# Patient Record
Sex: Male | Born: 1965 | Race: White | Hispanic: No | Marital: Married | Smoking: Never smoker
Health system: Southern US, Community
[De-identification: ages and names within clinical notes are randomized; demographics above are authoritative.]

## PROBLEM LIST (undated history)

## (undated) DIAGNOSIS — M4306 Spondylolysis, lumbar region: Secondary | ICD-10-CM

## (undated) DIAGNOSIS — E785 Hyperlipidemia, unspecified: Secondary | ICD-10-CM

## (undated) DIAGNOSIS — G4733 Obstructive sleep apnea (adult) (pediatric): Principal | ICD-10-CM

## (undated) DIAGNOSIS — K219 Gastro-esophageal reflux disease without esophagitis: Secondary | ICD-10-CM

## (undated) HISTORY — DX: Gastro-esophageal reflux disease without esophagitis: K21.9

## (undated) HISTORY — PX: WISDOM TOOTH EXTRACTION: SHX21

## (undated) HISTORY — DX: Spondylolysis, lumbar region: M43.06

## (undated) HISTORY — PX: DG 4TH DIGIT RIGHT HAND: HXRAD1651

## (undated) HISTORY — PX: TONSILLECTOMY: SUR1361

## (undated) HISTORY — DX: Hyperlipidemia, unspecified: E78.5

## (undated) HISTORY — PX: HERNIA REPAIR: SHX51

## (undated) HISTORY — DX: Obstructive sleep apnea (adult) (pediatric): G47.33

---

## 2016-01-15 ENCOUNTER — Ambulatory Visit (INDEPENDENT_AMBULATORY_CARE_PROVIDER_SITE_OTHER): Payer: 59 | Admitting: Family Medicine

## 2016-01-15 ENCOUNTER — Encounter: Payer: Self-pay | Admitting: Family Medicine

## 2016-01-15 VITALS — BP 126/80 | HR 80 | Temp 97.9°F | Resp 16 | Ht 73.0 in | Wt 280.0 lb

## 2016-01-15 DIAGNOSIS — Z0000 Encounter for general adult medical examination without abnormal findings: Secondary | ICD-10-CM | POA: Diagnosis not present

## 2016-01-15 DIAGNOSIS — M4306 Spondylolysis, lumbar region: Secondary | ICD-10-CM | POA: Insufficient documentation

## 2016-01-15 LAB — CBC WITH DIFFERENTIAL/PLATELET
BASOS ABS: 0.1 10*3/uL (ref 0.0–0.1)
Basophils Relative: 1 % (ref 0–1)
EOS PCT: 4 % (ref 0–5)
Eosinophils Absolute: 0.2 10*3/uL (ref 0.0–0.7)
HEMATOCRIT: 45.9 % (ref 39.0–52.0)
Hemoglobin: 16.1 g/dL (ref 13.0–17.0)
LYMPHS PCT: 31 % (ref 12–46)
Lymphs Abs: 1.7 10*3/uL (ref 0.7–4.0)
MCH: 30.4 pg (ref 26.0–34.0)
MCHC: 35.1 g/dL (ref 30.0–36.0)
MCV: 86.8 fL (ref 78.0–100.0)
MPV: 10 fL (ref 8.6–12.4)
Monocytes Absolute: 0.6 10*3/uL (ref 0.1–1.0)
Monocytes Relative: 10 % (ref 3–12)
Neutro Abs: 3 10*3/uL (ref 1.7–7.7)
Neutrophils Relative %: 54 % (ref 43–77)
PLATELETS: 206 10*3/uL (ref 150–400)
RBC: 5.29 MIL/uL (ref 4.22–5.81)
RDW: 13.1 % (ref 11.5–15.5)
WBC: 5.6 10*3/uL (ref 4.0–10.5)

## 2016-01-15 NOTE — Progress Notes (Signed)
Subjective:    Patient ID: Adam [name], male    DOB: 1966-06-03, 50 y.o.   MRN: [id number]  HPI  patient is here today to establish care. He has no major concerns. 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 He is currently in physical therapy which seems to be helping. Otherwise his only complaint is some mild pain in the midfoot of his right foot that seems to be improving. He is due for a flu shot but he politely declines Past Medical History  Diagnosis Date  . Lumbar pars defect     right sciatica/ Dr Lowella Dell   Past Surgical History  Procedure Laterality Date  . Hernia repair    . Tonsillectomy     No current outpatient prescriptions on file prior to visit.   No current facility-administered medications on file prior to visit.   No Known Allergies Social History   Social History  . Marital Status: Married    Spouse Name: N/A  . Number of Children: N/A  . Years of Education: N/A   Occupational History  . Not on file.   Social History Main Topics  . Smoking status: Never Smoker   . Smokeless tobacco: Not on file  . Alcohol Use: 0.0 oz/week    0 Standard drinks or equivalent per week     Comment: Very seldom  . Drug Use: No  . Sexual Activity: Not on file   Other Topics Concern  . Not on file   Social History Narrative  . No narrative on file   Family History  Problem Relation Age of Onset  . Dementia Mother   . Atrial fibrillation Father   . COPD Maternal Grandfather       Review of Systems  All other systems reviewed and are negative.      Objective:   Physical Exam  Constitutional: He is oriented to person, place, and time. He appears well-developed and well-nourished. No distress.  HENT:  Head: Normocephalic and atraumatic.  Right Ear: External ear normal.  Left Ear: External ear normal.  Nose: Nose normal.  Mouth/Throat: Oropharynx is clear and moist. No oropharyngeal  exudate.  Eyes: Conjunctivae and EOM are normal. Pupils are equal, round, and reactive to light. Right eye exhibits no discharge. Left eye exhibits no discharge. No scleral icterus.  Neck: Normal range of motion. Neck supple. No JVD present. No tracheal deviation present. No thyromegaly present.  Cardiovascular: Normal rate, regular rhythm, normal heart sounds and intact distal pulses.  Exam reveals no gallop and no friction rub.   No murmur heard. Pulmonary/Chest: Effort normal and breath sounds normal. No stridor. No respiratory distress. He has no wheezes. He has no rales. He exhibits no tenderness.  Abdominal: Soft. Bowel sounds are normal. He exhibits no distension and no mass. There is no tenderness. There is no rebound and no guarding. Hernia confirmed negative in the right inguinal area and confirmed negative in the left inguinal area.  Genitourinary: Rectum normal, prostate normal, testes normal and penis normal. Cremasteric reflex is present.  Musculoskeletal: Normal range of motion. He exhibits no edema or tenderness.  Lymphadenopathy:    He has no cervical adenopathy.       Right: No inguinal adenopathy present.       Left: No inguinal adenopathy present.  Neurological: He is alert and oriented to person, place, and time. He has normal reflexes. He displays normal reflexes. No  cranial nerve deficit. He exhibits normal muscle tone. Coordination normal.  Skin: Skin is warm. No rash noted. He is not diaphoretic. No erythema. No pallor.  Psychiatric: He has a normal mood and affect. His behavior is normal. Judgment and thought content normal.  Vitals reviewed.         Assessment & Plan:  Routine general medical examination at a health care facility - Plan: CBC with Differential/Platelet, COMPLETE METABOLIC PANEL WITH GFR, Lipid panel, PSA, Ambulatory referral to Gastroenterology   Physical exam is completely normal except for obesity. I recommended diet exercise and weight loss. I  will check a CBC, CMP, fasting lipid panel, and a PSA. I will also efer patient to GI for colonoscopy. I recommended a flu shot which he declined. The  Remainder of his preventative care is up-to-date

## 2016-01-16 LAB — LIPID PANEL
Cholesterol: 192 mg/dL (ref 125–200)
HDL: 53 mg/dL (ref >=40)
LDL CALC: 100 mg/dL (ref <130)
Total CHOL/HDL Ratio: 3.6 Ratio (ref <=5.0)
Triglycerides: 195 mg/dL — ABNORMAL HIGH (ref <150)
VLDL: 39 mg/dL — AB (ref <30)

## 2016-01-16 LAB — COMPLETE METABOLIC PANEL WITH GFR
ALT: 31 U/L (ref 9–46)
AST: 20 U/L (ref 10–40)
Albumin: 4.4 g/dL (ref 3.6–5.1)
Alkaline Phosphatase: 63 U/L (ref 40–115)
BUN: 13 mg/dL (ref 7–25)
CALCIUM: 9.3 mg/dL (ref 8.6–10.3)
CHLORIDE: 102 mmol/L (ref 98–110)
CO2: 27 mmol/L (ref 20–31)
CREATININE: 1.06 mg/dL (ref 0.60–1.35)
GFR, Est African American: >89 mL/min (ref >=60)
GFR, Est Non African American: 82 mL/min (ref >=60)
GLUCOSE: 94 mg/dL (ref 70–99)
Potassium: 4.3 mmol/L (ref 3.5–5.3)
Sodium: 140 mmol/L (ref 135–146)
Total Bilirubin: 0.4 mg/dL (ref 0.2–1.2)
Total Protein: 6.6 g/dL (ref 6.1–8.1)

## 2016-01-16 LAB — PSA: PSA: 1.6 ng/mL (ref <=4.00)

## 2016-01-18 ENCOUNTER — Encounter: Payer: Self-pay | Admitting: Family Medicine

## 2016-02-29 ENCOUNTER — Telehealth: Payer: Self-pay | Admitting: Family Medicine

## 2016-02-29 DIAGNOSIS — R0683 Snoring: Secondary | ICD-10-CM

## 2016-02-29 NOTE — Telephone Encounter (Signed)
Patients wife called and states that patient needs a referral for sleep clinic. She states he forgot to ask Dr. Dennard Schaumann about this when he was here for his CPE.  CB# for patient is [phone] or cell [phone]

## 2016-03-01 NOTE — Telephone Encounter (Signed)
OK to schedule - no mention of it in OV note - does he need f/u?

## 2016-03-01 NOTE — Telephone Encounter (Signed)
Ok to schedule.

## 2016-03-02 NOTE — Telephone Encounter (Signed)
Referral sent 

## 2016-03-03 ENCOUNTER — Encounter: Payer: Self-pay | Admitting: Gastroenterology

## 2016-04-01 ENCOUNTER — Ambulatory Visit (AMBULATORY_SURGERY_CENTER): Payer: Self-pay | Admitting: *Deleted

## 2016-04-01 ENCOUNTER — Encounter: Payer: Self-pay | Admitting: Gastroenterology

## 2016-04-01 VITALS — Ht 73.0 in | Wt 278.8 lb

## 2016-04-01 DIAGNOSIS — Z1211 Encounter for screening for malignant neoplasm of colon: Secondary | ICD-10-CM

## 2016-04-01 MED ORDER — SUPREP BOWEL PREP KIT 17.5-3.13-1.6 GM/177ML PO SOLN: 1.0000 | Freq: Once | ORAL | Status: DC

## 2016-04-01 NOTE — Progress Notes (Signed)
Patient denies any allergies to egg or soy products. Patient denies complications with anesthesia/sedation.  Patient denies oxygen use at home and denies diet medications. Emmi instructions for colonoscopy explained and given to patient.  

## 2016-04-15 ENCOUNTER — Encounter: Payer: Self-pay | Admitting: Gastroenterology

## 2016-04-15 ENCOUNTER — Ambulatory Visit (AMBULATORY_SURGERY_CENTER): Payer: 59 | Admitting: Gastroenterology

## 2016-04-15 VITALS — BP 134/75 | HR 61 | Temp 98.6°F | Resp 9 | Ht 73.0 in | Wt 278.0 lb

## 2016-04-15 DIAGNOSIS — D127 Benign neoplasm of rectosigmoid junction: Secondary | ICD-10-CM

## 2016-04-15 DIAGNOSIS — D125 Benign neoplasm of sigmoid colon: Secondary | ICD-10-CM

## 2016-04-15 DIAGNOSIS — D122 Benign neoplasm of ascending colon: Secondary | ICD-10-CM

## 2016-04-15 DIAGNOSIS — Z1211 Encounter for screening for malignant neoplasm of colon: Secondary | ICD-10-CM

## 2016-04-15 MED ORDER — SODIUM CHLORIDE 0.9 % IV SOLN: 500.0000 mL | INTRAVENOUS | Status: DC

## 2016-04-15 NOTE — Patient Instructions (Signed)
YOU HAD AN ENDOSCOPIC PROCEDURE TODAY AT [facility]:   Refer to the procedure report that was given to you for any specific questions about what was found during the examination.  If the procedure report does not answer your questions, please call your gastroenterologist to clarify.  If you requested that your care partner not be given the details of your procedure findings, then the procedure report has been included in a sealed envelope for you to review at your convenience later.  YOU SHOULD EXPECT: Some feelings of bloating in the abdomen. Passage of more gas than usual.  Walking can help get rid of the air that was put into your GI tract during the procedure and reduce the bloating. If you had a lower endoscopy (such as a colonoscopy or flexible sigmoidoscopy) you may notice spotting of blood in your stool or on the toilet paper. If you underwent a bowel prep for your procedure, you may not have a normal bowel movement for a few days.  Please Note:  You might notice some irritation and congestion in your nose or some drainage.  This is from the oxygen used during your procedure.  There is no need for concern and it should clear up in a day or so.  SYMPTOMS TO REPORT IMMEDIATELY:   Following lower endoscopy (colonoscopy or flexible sigmoidoscopy):  Excessive amounts of blood in the stool  Significant tenderness or worsening of abdominal pains  Swelling of the abdomen that is new, acute  Fever of 100F or higher   For urgent or emergent issues, a gastroenterologist can be reached at any hour by calling [phone].   DIET: Your first meal following the procedure should be a small meal and then it is ok to progress to your normal diet. Heavy or fried foods are harder to digest and may make you feel nauseous or bloated.  Likewise, meals heavy in dairy and vegetables can increase bloating.  Drink plenty of fluids but you should avoid alcoholic beverages for 24  hours.   PLease read all handouts given to you by your recovery nurse. ACTIVITY:  You should plan to take it easy for the rest of today and you should NOT DRIVE or use heavy machinery until tomorrow (because of the sedation medicines used during the test).    FOLLOW UP: Our staff will call the number listed on your records the next business day following your procedure to check on you and address any questions or concerns that you may have regarding the information given to you following your procedure. If we do not reach you, we will leave a message.  However, if you are feeling well and you are not experiencing any problems, there is no need to return our call.  We will assume that you have returned to your regular daily activities without incident.  If any biopsies were taken you will be contacted by phone or by letter within the next 1-3 weeks.  Please call us at [phone] if you have not heard about the biopsies in 3 weeks.    SIGNATURES/CONFIDENTIALITY: You and/or your care partner have signed paperwork which will be entered into your electronic medical record.  These signatures attest to the fact that that the information above on your After Visit Summary has been reviewed and is understood.  Full responsibility of the confidentiality of this discharge information lies with you and/or your care-partner.  Thank you for letting us take care of your healthcare needs today.

## 2016-04-15 NOTE — Progress Notes (Signed)
Called to room to assist during endoscopic procedure.  Patient ID and intended procedure confirmed with present staff. Received instructions for my participation in the procedure from the performing physician.  

## 2016-04-15 NOTE — Progress Notes (Signed)
Stable to RR 

## 2016-04-15 NOTE — Op Note (Signed)
[facility] Patient Name: Adam [name] Procedure Date: 04/15/2016 8:35 AM MRN: [id number] Endoscopist: Mauri Pole , MD Age: 50 Referring MD:  Date of Birth: 04-06-1966 Gender: Male Procedure:                Colonoscopy Indications:              Screening for colorectal malignant neoplasm, This                            is the patient's first colonoscopy Medicines:                Monitored Anesthesia Care Procedure:                Pre-Anesthesia Assessment:                           - Prior to the procedure, a History and Physical                            was performed, and patient medications and                            allergies were reviewed. The patient's tolerance of                            previous anesthesia was also reviewed. The risks                            and benefits of the procedure and the sedation                            options and risks were discussed with the patient.                            All questions were answered, and informed consent                            was obtained. Prior Anticoagulants: The patient has                            taken no previous anticoagulant or antiplatelet                            agents. ASA Grade Assessment: II - A patient with                            mild systemic disease. After reviewing the risks                            and benefits, the patient was deemed in                            satisfactory condition to undergo the procedure.  After obtaining informed consent, the colonoscope                            was passed under direct vision. Throughout the                            procedure, the patient's blood pressure, pulse, and                            oxygen saturations were monitored continuously. The                            Model CF-HQ190L [id number]) scope was introduced                            through the anus and advanced to the the cecum,                             identified by appendiceal orifice and ileocecal                            valve. The colonoscopy was performed without                            difficulty. The patient tolerated the procedure                            well. The quality of the bowel preparation was                            excellent. The ileocecal valve, appendiceal                            orifice, and rectum were photographed. Scope In: 8:43:36 AM Scope Out: 9:02:02 AM Scope Withdrawal Time: 0 hours 15 minutes 30 seconds  Total Procedure Duration: 0 hours 18 minutes 26 seconds  Findings:                 The perianal and digital rectal examinations were                            normal.                           Four sessile polyps were found in the sigmoid colon                            and ascending colon. The polyps were 4 to 7 mm in                            size. These polyps were removed with a cold snare.                            Resection and retrieval were complete.  Two sessile polyps were found in the recto-sigmoid                            colon. The polyps were 2 to 4 mm in size. These                            polyps were removed with a cold biopsy forceps.                            Resection and retrieval were complete.                           Non-bleeding internal hemorrhoids were found during                            retroflexion. The hemorrhoids were small.                           A few small-mouthed diverticula were found in the                            descending colon. Complications:            No immediate complications. Estimated Blood Loss:     Estimated blood loss was minimal. Impression:               - Four 4 to 7 mm polyps in the sigmoid colon and in                            the ascending colon, removed with a cold snare.                            Resected and retrieved.                           - Two 2 to 4 mm  polyps at the recto-sigmoid colon,                            removed with a cold biopsy forceps. Resected and                            retrieved.                           - Non-bleeding internal hemorrhoids.                           - Diverticulosis in the descending colon. Recommendation:           - Patient has a contact number available for                            emergencies. The signs and symptoms of potential  delayed complications were discussed with the                            patient. Return to normal activities tomorrow.                            Written discharge instructions were provided to the                            patient.                           - Resume previous diet.                           - Continue present medications.                           - Await pathology results.                           - Repeat colonoscopy in 3 - 5 years for                            surveillance.                           - Return to GI clinic PRN. Mauri Pole, MD 04/15/2016 9:07:26 AM This report has been signed electronically.

## 2016-04-18 ENCOUNTER — Telehealth: Payer: Self-pay | Admitting: *Deleted

## 2016-04-18 NOTE — Telephone Encounter (Signed)
  Follow up Call-  Call back number 04/15/2016  Post procedure Call Back phone  # [phone]  Permission to leave phone message Yes   [facility]

## 2016-04-22 ENCOUNTER — Encounter: Payer: Self-pay | Admitting: Gastroenterology

## 2016-08-04 ENCOUNTER — Encounter: Payer: Self-pay | Admitting: Family Medicine

## 2016-08-04 ENCOUNTER — Ambulatory Visit (INDEPENDENT_AMBULATORY_CARE_PROVIDER_SITE_OTHER): Payer: 59 | Admitting: Family Medicine

## 2016-08-04 VITALS — BP 130/78 | HR 78 | Temp 97.4°F | Resp 16 | Ht 73.0 in | Wt 277.0 lb

## 2016-08-04 DIAGNOSIS — G4730 Sleep apnea, unspecified: Secondary | ICD-10-CM | POA: Diagnosis not present

## 2016-08-04 DIAGNOSIS — G4710 Hypersomnia, unspecified: Secondary | ICD-10-CM | POA: Diagnosis not present

## 2016-08-04 DIAGNOSIS — R0683 Snoring: Secondary | ICD-10-CM

## 2016-08-04 NOTE — Progress Notes (Signed)
Subjective:    Patient ID: Adam [name], male    DOB: 08-24-1966, 50 y.o.   MRN: [id number]  HPI  01/2016 Patient is here today to establish care. He has no major concerns. 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 He is currently in physical therapy which seems to be helping. Otherwise his only complaint is some mild pain in the midfoot of his right foot that seems to be improving. He is due for a flu shot but he politely declines.  At that time, my plan was:  Physical exam is completely normal except for obesity. I recommended diet exercise and weight loss. I will check a CBC, CMP, fasting lipid panel, and a PSA. I will also efer patient to GI for colonoscopy. I recommended a flu shot which he declined. The  Remainder of his preventative care is up-to-date  08/04/16 Patient states that he was diagnosed with sleep apnea in the remote past. However he never went through the titration study to be established with CPAP. He states that his wife is concerned because she hears him snore loudly and also stop breathing occasionally. On examination today he does have a large uvula and a low hanging palate. His neck circumference is 17 inches. His mandible length is over 5 inches. However his Epworth sleepiness score is 12-13 indicating a high likelihood of sleep apnea. The patient states that he falls asleep occasionally while driving. This is a high likelihood of recurring if he's not drinking something with caffeine. He can fall asleep easily while sitting still, lying down in the afternoon, reading, watching TV. If he is keeping himself busy he does not fall sleep while busy. Therefore he does not fall asleep if he's talking to an individual. However he does report hypersomnolence Past Medical History:  Diagnosis Date  . GERD (gastroesophageal reflux disease)   . 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)   Past Surgical  History:  Procedure Laterality Date  . DG 4TH DIGIT RIGHT HAND    . HERNIA REPAIR    . TONSILLECTOMY    . WISDOM TOOTH EXTRACTION     Current Outpatient Prescriptions on File Prior to Visit  Medication Sig Dispense Refill  . acetaminophen (TYLENOL) 500 MG tablet Take 500 mg by mouth every 6 (six) hours as needed.    . Ascorbic Acid (VITAMIN C) 1000 MG tablet Take 1,000 mg by mouth daily.    [name] b complex vitamins tablet Take 1 tablet by mouth daily.    . Cholecalciferol (VITAMIN D) 2000 units CAPS Take by mouth.    . famotidine (PEPCID) 20 MG tablet Take 20 mg by mouth daily. 4 - 5 times per week    . Lysine 500 MG TABS Take 500 mg by mouth as needed.    . meloxicam (MOBIC) 15 MG tablet Take 15 mg by mouth daily.    [name] OVER THE COUNTER MEDICATION Take 1 tablet by mouth daily. Multi Vitamin - [employer] Brand     No current facility-administered medications on file prior to visit.    No Known Allergies Social History   Social History  . Marital status: Married    Spouse name: N/A  . Number of children: N/A  . Years of education: N/A   Occupational History  . Not on file.   Social History Main Topics  . Smoking status: Never Smoker  . Smokeless  tobacco: Never Used  . Alcohol use 0.0 oz/week     Comment: Very seldom - beer  . Drug use: No  . Sexual activity: Not on file   Other Topics Concern  . Not on file   Social History Narrative  . No narrative on file   Family History  Problem Relation Age of Onset  . Dementia Mother   . Atrial fibrillation Father   . Colon polyps Father   . COPD Maternal Grandfather   . Colon cancer Neg Hx   . Esophageal cancer Neg Hx   . Rectal cancer Neg Hx   . Stomach cancer Neg Hx       Review of Systems  All other systems reviewed and are negative.      Objective:   Physical Exam  Constitutional: He is oriented to person, place, and time. He appears well-developed and well-nourished. No distress.  HENT:  Head: Normocephalic and  atraumatic.  Right Ear: External ear normal.  Left Ear: External ear normal.  Nose: Nose normal.  Mouth/Throat: Oropharynx is clear and moist. No oropharyngeal exudate.  Neck: Normal range of motion. Neck supple. No JVD present. No tracheal deviation present. No thyromegaly present.  Cardiovascular: Normal rate, regular rhythm, normal heart sounds and intact distal pulses.  Exam reveals no gallop and no friction rub.   No murmur heard. Pulmonary/Chest: Effort normal and breath sounds normal. No stridor. No respiratory distress. He has no wheezes. He has no rales. He exhibits no tenderness.  Genitourinary: Testes normal. Cremasteric reflex is present.  Lymphadenopathy:    He has no cervical adenopathy.  Neurological: He is alert and oriented to person, place, and time. He has normal reflexes. No cranial nerve deficit. He exhibits normal muscle tone. Coordination normal.  Skin: Skin is warm. No rash noted. He is not diaphoretic. No erythema. No pallor.  Psychiatric: He has a normal mood and affect. His behavior is normal. Judgment and thought content normal.  Vitals reviewed.         Assessment & Plan:  Patient certainly is at high risk for obstructive sleep apnea. I will schedule the patient for split level sleep study through [facility] neurology. Follow-up after the study has been performed

## 2016-08-31 ENCOUNTER — Other Ambulatory Visit: Payer: Self-pay | Admitting: Family Medicine

## 2016-08-31 DIAGNOSIS — G4710 Hypersomnia, unspecified: Secondary | ICD-10-CM

## 2016-08-31 DIAGNOSIS — R0683 Snoring: Secondary | ICD-10-CM

## 2016-08-31 DIAGNOSIS — G4730 Sleep apnea, unspecified: Secondary | ICD-10-CM

## 2016-09-06 ENCOUNTER — Ambulatory Visit (INDEPENDENT_AMBULATORY_CARE_PROVIDER_SITE_OTHER): Payer: 59 | Admitting: Neurology

## 2016-09-06 ENCOUNTER — Encounter: Payer: Self-pay | Admitting: Neurology

## 2016-09-06 VITALS — BP 138/88 | HR 72 | Resp 20 | Ht 73.0 in | Wt 281.0 lb

## 2016-09-06 DIAGNOSIS — G4733 Obstructive sleep apnea (adult) (pediatric): Secondary | ICD-10-CM | POA: Insufficient documentation

## 2016-09-06 DIAGNOSIS — G4726 Circadian rhythm sleep disorder, shift work type: Secondary | ICD-10-CM

## 2016-09-06 DIAGNOSIS — G4730 Sleep apnea, unspecified: Secondary | ICD-10-CM

## 2016-09-06 DIAGNOSIS — G4710 Hypersomnia, unspecified: Secondary | ICD-10-CM | POA: Diagnosis not present

## 2016-09-06 HISTORY — DX: Obstructive sleep apnea (adult) (pediatric): G47.33

## 2016-09-06 NOTE — Patient Instructions (Signed)

## 2016-09-06 NOTE — Progress Notes (Signed)
SLEEP MEDICINE CLINIC   Provider:  Larey Seat, M D  Referring Provider: Susy Frizzle, MD Primary Care Physician:  Adam Fraction, MD  Chief Complaint  Patient presents with  . New Patient (Initial Visit)    had sleep study in 2012 but no copy of it    HPI:  Adam [name] is a 50 y.o. male , seen here as a referral from Dr. Dennard [name] .   Chief complaint according to patient : " just being tired a lot "   Mr. Adam [name] had actually been evaluated for sleep apnea and was found in 2012 that he had sleep apnea, he was supposed to return for CPAP titration but in the meantime was transferred and his job took him to a different part of the [place], he moved to [place] and [place]. He works for [employer] 31 years.  He is now returned to [facility] and stated to his primary care physician that he still suffers from daytime fatigue, sleepiness overall her lack of energy. He is currently also suffering from some lower back pain, has completed physical therapy and has discussed with his primary care physician if injections may help this particular concern. He saw Dr. Dennard [name] on 08/04/2016 and mentioned that his wife is concerned because she her some snore loudly and sometimes to stop breathing. He indicated also that he was excessively daytime sleepy endorsing the Epworth sleepiness score at 13 points. He can fall easily asleep, if he is physically not active or mentally not stimulated.  He also suffers from some acid reflux and right-sided sciatica.  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  It'll take about 30-40 minutes to wind down to be ready to go to sleep. He will retreat to the bedroom usually around 4- 5 AM. Usually he will sleep within 30 minutes or less, his bedroom is corneal, quiet and dark. He sleeps usually on his side, he usually uses one  pillow for head support. The head of bed is not elevated. His wife has noted him to snore in any position. He usually has about 3 bathroom breaks each night, after which he  has no trouble going back to sleep. His wife homeschools the children ( 53, 41 and 48 years old), he can sleep through. He uses a fan.  He usually tries to arise by 11 AM, needs an alarm. He  feels still very tired at that time. His sleep has not been as refreshing and restoring. Occasionally he will have a dry mouth but he does not have morning headaches, no diaphoresis or palpitations were noted.  Sleep medical history and family sleep history:  Mother has OSA, one sister healthy, is now 76 year old son used to sleep walk in childhood.   Social history: married, with children, full time employed, Surveyor, quantity , shift Insurance underwriter.  Caffeine use ; decaffeinated tea, 3-4 glasses, 1 mountain dew a day, coffee- none.    Review of Systems: Out of a complete 14 system review, the patient complains of only the following symptoms, and all other reviewed systems are negative. Snoring, daytime fatigue, shift work  Epworth score 16 , Fatigue severity score 31  , depression score 2/15    Social History   Social History  . Marital status: Married    Spouse name: N/A  . Number of children: N/A  .  Years of education: N/A   Occupational History  . Not on file.   Social History Main Topics  . Smoking status: Never Smoker  . Smokeless tobacco: Never Used  . Alcohol use 0.0 oz/week     Comment: Very seldom - beer  . Drug use: No  . Sexual activity: Not on file   Other Topics Concern  . Not on file   Social History Narrative  . No narrative on file    Family History  Problem Relation Age of Onset  . Dementia Mother   . Atrial fibrillation Father   . Colon polyps Father   . COPD Maternal Grandfather   . Colon cancer Neg Hx   . Esophageal cancer Neg Hx   . Rectal cancer Neg Hx   . Stomach cancer Neg Hx      Past Medical History:  Diagnosis Date  . GERD (gastroesophageal reflux disease)   . 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)    Past Surgical History:  Procedure Laterality Date  . DG 4TH DIGIT RIGHT HAND    . HERNIA REPAIR    . TONSILLECTOMY    . WISDOM TOOTH EXTRACTION      Current Outpatient Prescriptions  Medication Sig Dispense Refill  . acetaminophen (TYLENOL) 500 MG tablet Take 500 mg by mouth every 6 (six) hours as needed.    . Ascorbic Acid (VITAMIN C) 1000 MG tablet Take 1,000 mg by mouth daily.    [name] b complex vitamins tablet Take 1 tablet by mouth daily.    . Cholecalciferol (VITAMIN D) 2000 units CAPS Take by mouth.    . famotidine (PEPCID) 20 MG tablet Take 20 mg by mouth daily. 4 - 5 times per week    . ibuprofen (ADVIL,MOTRIN) 600 MG tablet Take 600 mg by mouth every 6 (six) hours as needed.    [name] Lysine 500 MG TABS Take 500 mg by mouth as needed.    . meloxicam (MOBIC) 15 MG tablet Take 15 mg by mouth daily.    [name] OVER THE COUNTER MEDICATION Take 1 tablet by mouth daily. Multi Vitamin - [employer] Brand     No current facility-administered medications for this visit.     Allergies as of 09/06/2016  . (No Known Allergies)    Vitals: BP 138/88   Pulse 72   Resp 20   Ht 6\' 1"  (1.854 m)   Wt 281 lb (127.5 kg)   BMI 37.07 kg/m  Last Weight:  Wt Readings from Last 1 Encounters:  09/06/16 281 lb (127.5 kg)   [id number] mass index is 37.07 kg/m.     Last Height:   Ht Readings from Last 1 Encounters:  09/06/16 6\' 1"  (1.854 m)    Physical exam:  General: The patient is awake, alert and appears not in acute distress. The patient is well groomed. Head: Normocephalic, atraumatic. Neck is supple. Mallampati 2-3 , uvula lifts above tongue ground,,  neck circumference:17.25  Nasal airflow patent , TMJ is not evident . Retrognathia is not seen.  Cardiovascular:  Regular rate and rhythm, without  murmurs or carotid bruit, and without distended neck  veins. Respiratory: Lungs are clear to auscultation. Skin:  Without evidence of edema, or rash Trunk: BMI is 37. The patient's posture is erect.   Neurologic exam : The patient is awake and alert, oriented to place and time.   Memory subjective described as intact.  Attention span & concentration ability appears normal.  Speech is fluent, without  dysarthria, dysphonia or aphasia.  Mood and affect are appropriate.  Cranial nerves: no abnormalities of taste or smell.  Pupils are equal and briskly reactive to light. Extraocular movements  in vertical and horizontal planes intact and without nystagmus. Visual fields by finger perimetry are intact.Hearing to finger rub intact.  Facial sensation intact to fine touch. Facial motor strength is symmetric and tongue and uvula move midline. Shoulder shrug was symmetrical.   Motor exam: Normal tone, muscle bulk and symmetric strength in all extremities. Sensory:  Fine touch, pinprick and vibration were tested in all extremities. Proprioception tested in the upper extremities was normal. Coordination: Rapid alternating movements in the fingers/hands was normal. Finger-to-nose maneuver  normal without evidence of ataxia, dysmetria or tremor.  Gait and station: Patient walks without assistive device and is able unassisted to climb up to the exam table. Strength within normal limits.  Stance is stable and normal.   Deep tendon reflexes: in the upper and lower extremities are symmetric and intact. Babinski maneuver response is  downgoing.  The patient was advised of the nature of the diagnosed sleep disorder , the treatment options and risks for general a health and wellness arising from not treating the condition.  I spent more than 45 minutes of face to face time with the patient. Greater than 50% of time was spent in counseling and coordination of care. We have discussed the diagnosis and differential and I answered the patient's questions.      Assessment:  After physical and neurologic examination, review of laboratory studies,  Personal review of imaging studies, reports of other /same  Imaging studies ,  Results of polysomnography/ neurophysiology testing and pre-existing records as far as provided in visit., my assessment is   1)  Mr. Leetch wife has witnessed her husband to snore for over a decade, and she has also noted him to have apneas. He was diagnosed over 6 years ago with sleep apnea but the time couldn't follow through with a CPAP titration. 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 He actually works 5 days a week and a late shift situation and was only 2 days to recover his circadian rhythm is bound to be nocturnal.  Plan:  Treatment plan and additional workup :  I would like for Mr. Haser to have and attended titration study was 1 hour baseline, as I want to confirm that he still has apnea but also the degree and character of apnea. I suspect that these will be obstructive sleep apneas. He will need to come to the sleep lab in AM and sleep through the day.  Should his apnea be insignificant, and should he be mainly a snorer but could be considered to use a dental device for treatment of snoring and mild apnea which was advanced the lower jaw forward him a thereby creating room in the upper airway. This treatment method is only feasible for non-REM dependent apnea and apnea patients that do not have significant oxygen desaturations.  Rv after CPAP.    Asencion Partridge Latishia Suitt MD  09/06/2016   CC: Adam [name], [address] [place],  [id number]

## 2016-09-13 ENCOUNTER — Telehealth: Payer: Self-pay | Admitting: Neurology

## 2016-09-13 DIAGNOSIS — G4733 Obstructive sleep apnea (adult) (pediatric): Secondary | ICD-10-CM

## 2016-09-13 NOTE — Telephone Encounter (Signed)
[employer] denied CPAP and suggested HST.  Can I get an order for HST?

## 2016-09-27 NOTE — Addendum Note (Signed)
Addended by: Larey Seat on: 09/27/2016 04:20 PM   Modules accepted: Orders

## 2016-10-17 ENCOUNTER — Ambulatory Visit (INDEPENDENT_AMBULATORY_CARE_PROVIDER_SITE_OTHER): Payer: 59 | Admitting: Neurology

## 2016-10-17 DIAGNOSIS — G4733 Obstructive sleep apnea (adult) (pediatric): Secondary | ICD-10-CM | POA: Diagnosis not present

## 2016-10-24 NOTE — Procedures (Signed)
[facility] [address] [place], [place] [address] NAME: Adam [name] DOB: 10/30/1966 MEDICAL RECORD [id number] DOS:  10/18/16 REFERRING PHYSICIAN: Jenna Luo, MD  STUDY PERFORMED:  HST/ Out of Center Sleep Test  HISTORY: Patient is a 50 y.o. male with medical history of GERD and Lumbar DDD. He had sleep study in 2012, was diagnosed with obstructive sleep apnea. He moved to different state before receiving CPAP. He reports his wife has witnessed apnea , he snores loudly in any position and excessive daytime sleepiness. He works very late hours. Epworth Sleepiness score 16 points, Fatigue Severity score 31 points  STUDY RESULTS: Total Recording Time:  6h 54 m  Total Apnea/Hypopnea Index (AHI) 30.8/hr.  Average Oxygen Desaturation: SPo2 at 94%  Lowest Oxygen desaturation: 72% Spo2 nadir  IMPRESSION: Moderate sleep apnea by AHI 30.8/hr. of recording,  RDI 33/hr.  oxygen desaturation frequency 19.2/ hr. of recording.   No tachy- brady arrhythmia noted. Time at or below Spo2 89%  was 21 minutes.   RECOMMENDATION: Auto titration CPAP is recommended, 5-15 cm water pressure range, mask to be fitted. If the patient considers himself claustrophobic , a dental device could be used to control apnea without clinically significant  hypoxemia. Both treatment options will treat apnea and snoring.  I certify that I have reviewed the raw data recording prior to the issuance of this report in accordance with the standards of Accreditation of the American Academy of Sleep medicine (AASM)   Larey Seat, MD   10-17-2016  Diplomat, American Board of Psychiatry and Neurology, Diplomat, American Board of [place] Director, [employer]

## 2016-10-25 ENCOUNTER — Encounter: Payer: Self-pay | Admitting: Family Medicine

## 2016-10-25 ENCOUNTER — Telehealth: Payer: Self-pay

## 2016-10-25 DIAGNOSIS — G4733 Obstructive sleep apnea (adult) (pediatric): Secondary | ICD-10-CM

## 2016-10-25 NOTE — Telephone Encounter (Signed)
I spoke to patient and he is aware of results and recommendations. He is willing to start PAP treatment. I will send report to PCP. I will send orders to DME. Patient will get a letter reminding him to make f/u appt and stress the importance of compliance

## 2016-10-25 NOTE — Telephone Encounter (Signed)
-----   Message from Larey Seat, MD sent at 10/24/2016  5:03 PM EST ----- IMPRESSION: Moderate sleep apnea by AHI 30.8/hr. of recording, RDI 33/hr.  Oxygen desaturation frequency 19.2/ hr. of recording.   No tachy- brady arrhythmia noted. Time at or below Spo2 89%  was 21 minutes.   RECOMMENDATION: Auto titration CPAP is recommended, 5-15 cm water pressure range, mask to be fitted. If the patient considers himself claustrophobic , a dental device could be used to control apnea without clinically significant  hypoxemia. Both treatment options will treat apnea and snoring.

## 2016-10-25 NOTE — Progress Notes (Signed)
Has moderate sleep apnea.  They recommend CPAP with auto-titration.  Are they handling this or do we need to schedule?

## 2016-10-26 NOTE — Telephone Encounter (Signed)
Patient would like to start treatment with Auto-PAP, please place order. Thank you.

## 2016-10-26 NOTE — Addendum Note (Signed)
Addended by: Larey Seat on: 10/26/2016 12:15 PM   Modules accepted: Orders

## 2016-11-24 DIAGNOSIS — G4733 Obstructive sleep apnea (adult) (pediatric): Secondary | ICD-10-CM | POA: Diagnosis not present

## 2016-12-24 DIAGNOSIS — M545 Low back pain: Secondary | ICD-10-CM | POA: Diagnosis not present

## 2016-12-25 DIAGNOSIS — G4733 Obstructive sleep apnea (adult) (pediatric): Secondary | ICD-10-CM | POA: Diagnosis not present

## 2016-12-27 DIAGNOSIS — M545 Low back pain: Secondary | ICD-10-CM | POA: Diagnosis not present

## 2017-01-16 ENCOUNTER — Encounter (INDEPENDENT_AMBULATORY_CARE_PROVIDER_SITE_OTHER): Payer: Self-pay

## 2017-01-16 ENCOUNTER — Encounter: Payer: Self-pay | Admitting: Neurology

## 2017-01-16 ENCOUNTER — Ambulatory Visit (INDEPENDENT_AMBULATORY_CARE_PROVIDER_SITE_OTHER): Payer: BLUE CROSS/BLUE SHIELD | Admitting: Neurology

## 2017-01-16 VITALS — BP 127/74 | HR 72 | Resp 20 | Ht 73.0 in | Wt 288.0 lb

## 2017-01-16 DIAGNOSIS — Z9989 Dependence on other enabling machines and devices: Secondary | ICD-10-CM

## 2017-01-16 DIAGNOSIS — G4733 Obstructive sleep apnea (adult) (pediatric): Secondary | ICD-10-CM | POA: Diagnosis not present

## 2017-01-16 NOTE — Progress Notes (Signed)
SLEEP MEDICINE CLINIC   Provider:  Larey Seat, M D  Referring Provider: Susy Frizzle, MD Primary Care Physician:  Adam Fraction, MD  Chief Complaint  Patient presents with  . Follow-up    cpap    HPI:  Adam [name] is a 51 y.o. male , seen here as a referral from Dr. Dennard [name] .   Chief complaint according to patient : " just being tired a lot "   Mr. Adam [name] had actually been evaluated for sleep apnea and was found in 2012 that he had sleep apnea, he was supposed to return for CPAP titration but in the meantime was transferred and his job took him to a different part of the [place], he moved to [place] and [place]. He works for [employer] 31 years.  He is now returned to [facility] and stated to his primary care physician that he still suffers from daytime fatigue, sleepiness overall her lack of energy. He is currently also suffering from some lower back pain, has completed physical therapy and has discussed with his primary care physician if injections may help this particular concern. He saw Dr. Dennard [name] on 08/04/2016 and mentioned that his wife is concerned because she her some snore loudly and sometimes to stop breathing. He indicated also that he was excessively daytime sleepy endorsing the Epworth sleepiness score at 13 points. He can fall easily asleep, if he is physically not active or mentally not stimulated.  He also suffers from some acid reflux and right-sided sciatica.  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  It'll take about 30-40 minutes to wind down to be ready to go to sleep. He will retreat to the bedroom usually around 4- 5 AM. Usually he will sleep within 30 minutes or less, his bedroom is corneal, quiet and dark. He sleeps usually on his side, he usually uses one pillow for head support. The head of bed is not  elevated. His wife has noted him to snore in any position. He usually has about 3 bathroom breaks each night, after which he  has no trouble going back to sleep. His wife homeschools the children ( 28, 32 and 29 years old), he can sleep through. He uses a fan.  He usually tries to arise by 11 AM, needs an alarm. He  feels still very tired at that time. His sleep has not been as refreshing and restoring. Occasionally he will have a dry mouth but he does not have morning headaches, no diaphoresis or palpitations were noted.  Sleep medical history and family sleep history:  Mother has OSA, one sister healthy, is now 22 year old son used to sleep walk in childhood. Social history: married, with children, full time employed, Surveyor, quantity , shift Insurance underwriter.  Caffeine use ; decaffeinated tea, 3-4 glasses, 1 mountain dew a day, coffee- none.  Interval history from 01/16/2017, Adam [name] had undergone a home sleep test dated 10/18/2016 which revealed moderate sleep apnea by an AHI of 30.8, the RDI was 33.0 there was no evidence of tachycardia or bradycardia arrhythmia. The time at or below oxygen desaturation of 89% was a total of 21 minutes and is considered minor. Based on this and his high degree of daytime sleepiness and was recommended to undergo CPAP therapy as it would treat both apnea, and snoring, reflected and the RDI. The  patient has been fitted with an auto CPAP between 5 and 12 cm water and 3 cm EPR it appears that his 95th percentile pressure is around 11 cm the residual AHI is 4.3 and could certainly be improved upon. His compliance is 87% was 5 hours and 52 minutes average user time. He endorsed today the Epworth sleepiness score at 11 points which is significantly lower than pre-CPAP. He still feels that he is not quite accustomed to the CPAP machine.  He is using a dream wear interface made by [employer].     Review of Systems: Out of a complete 14 system review, the  patient complains of only the following symptoms, and all other reviewed systems are negative. Snoring, daytime fatigue, shift work.  Less nocturia on CPAP - no bathroom breaks.  Normal BP today,   Back pain    Epworth score  11 down form 16 , Fatigue severity score  N/a from 31  , depression score n/a    Social History   Social History  . Marital status: Married    Spouse name: N/A  . Number of children: N/A  . Years of education: N/A   Occupational History  . Not on file.   Social History Main Topics  . Smoking status: Never Smoker  . Smokeless tobacco: Never Used  . Alcohol use 0.0 oz/week     Comment: Very seldom - beer  . Drug use: No  . Sexual activity: Not on file   Other Topics Concern  . Not on file   Social History Narrative  . No narrative on file    Family History  Problem Relation Age of Onset  . Dementia Mother   . Atrial fibrillation Father   . Colon polyps Father   . COPD Maternal Grandfather   . Colon cancer Neg Hx   . Esophageal cancer Neg Hx   . Rectal cancer Neg Hx   . Stomach cancer Neg Hx     Past Medical History:  Diagnosis Date  . GERD (gastroesophageal reflux disease)   . 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 OSA (obstructive sleep apnea) 09/06/2016   AHI-30    Past Surgical History:  Procedure Laterality Date  . DG 4TH DIGIT RIGHT HAND    . HERNIA REPAIR    . TONSILLECTOMY    . WISDOM TOOTH EXTRACTION      Current Outpatient Prescriptions  Medication Sig Dispense Refill  . acetaminophen (TYLENOL) 500 MG tablet Take 500 mg by mouth every 6 (six) hours as needed.    . Ascorbic Acid (VITAMIN C) 1000 MG tablet Take 1,000 mg by mouth daily.    [name] b complex vitamins tablet Take 1 tablet by mouth daily.    . Cholecalciferol (VITAMIN D) 2000 units CAPS Take by mouth.    . famotidine (PEPCID) 20 MG tablet Take 20 mg by mouth daily. 4 - 5 times per week    . ibuprofen (ADVIL,MOTRIN) 600 MG tablet Take 600 mg by  mouth every 6 (six) hours as needed.    [name] Lysine 500 MG TABS Take 500 mg by mouth as needed.    . meloxicam (MOBIC) 15 MG tablet Take 15 mg by mouth daily.    [name] OVER THE COUNTER MEDICATION Take 1 tablet by mouth daily. Multi Vitamin - [employer] Brand     No current facility-administered medications for this visit.     Allergies as of 01/16/2017  . (  No Known Allergies)    Vitals: BP 127/74   Pulse 72   Resp 20   Ht 6\' 1"  (1.854 m)   Wt 288 lb (130.6 kg)   BMI 38.00 kg/m  Last Weight:  Wt Readings from Last 1 Encounters:  01/16/17 288 lb (130.6 kg)   [id number] mass index is 38 kg/m.     Last Height:   Ht Readings from Last 1 Encounters:  01/16/17 6\' 1"  (1.854 m)    Physical exam:  General: The patient is awake, alert and appears not in acute distress. The patient is well groomed. Head: Normocephalic, atraumatic. Neck is supple. Mallampati 2-3 , uvula lifts above tongue ground,,  neck circumference:17.25  Nasal airflow patent , TMJ is not evident . Retrognathia is not seen.  Cardiovascular:  Regular rate and rhythm, without  murmurs or carotid bruit, and without distended neck veins. Respiratory: Trunk: BMI is 38 The patient's posture is erect.   Neurologic exam :The patient is awake and alert, oriented to place and time.   Cranial nerves: no abnormalities of taste or smell.  Pupils are equal and briskly reactive to light.  Visual fields by finger perimetry are intact.Hearing to finger rub intact.  Facial sensation intact to fine touch. Facial motor strength is symmetric and tongue and uvula move midline. Shoulder shrug was symmetrical.   Motor exam: Normal tone, muscle bulk and symmetric strength in all extremities. Strength within normal limits.Stance is stable and normal.  Deep tendon reflexes: in the upper and lower extremities are symmetric and intact. Babinski maneuver response is  downgoing.  The patient was advised of the nature of the diagnosed sleep disorder , the  treatment options and risks for general a health and wellness arising from not treating the condition.  I spent more than 15 minutes of face to face time with the patient. Greater than 50% of time was spent in counseling and coordination of care. We have discussed the diagnosis and differential and I answered the patient's questions.     Assessment:  After physical and neurologic examination, review of laboratory studies,   Results of polysomnography/ neurophysiology testing and pre-existing records as far as provided in visit., my assessment is : Obstructive sleep apnea was diagnosed with an AHI of 30.8 moderate severity, RDI was 33 per hour indicating that the patient snored loudly and has some additional upper airway resistance. Oxygen desaturation frequency was not as frequent as apnea and there was no tachycardia noted in response to apnea. Oxygen nadir was 72% I would like for the patient to continue with auto titration CPAP and with the current interface. I will increase the upper limit of pressure to 15 cm water from currently 12 to allow some additional room. His back pain is currently treated with meloxicam and a steroid taper was used. No opiates have been prescribed.   1) Plan:  5-15 auto-titration, 3 cm EPR.  Reduced EDS is a good success, complete resolution of a Nocturia, Snoring ! .    Treatment plan and additional workup : Rv in 12 month with NP.    Asencion Partridge Ayeza Therriault MD  01/16/2017   CC: Adam [name], [address] [place], [place] [id number]

## 2017-01-16 NOTE — Patient Instructions (Signed)

## 2017-01-22 DIAGNOSIS — G4733 Obstructive sleep apnea (adult) (pediatric): Secondary | ICD-10-CM | POA: Diagnosis not present

## 2017-02-22 DIAGNOSIS — G4733 Obstructive sleep apnea (adult) (pediatric): Secondary | ICD-10-CM | POA: Diagnosis not present

## 2017-07-05 ENCOUNTER — Ambulatory Visit (INDEPENDENT_AMBULATORY_CARE_PROVIDER_SITE_OTHER): Payer: BLUE CROSS/BLUE SHIELD | Admitting: Family Medicine

## 2017-07-05 ENCOUNTER — Encounter: Payer: Self-pay | Admitting: Family Medicine

## 2017-07-05 VITALS — BP 120/64 | HR 76 | Temp 97.6°F | Resp 18 | Ht 73.0 in | Wt 288.0 lb

## 2017-07-05 DIAGNOSIS — Z0000 Encounter for general adult medical examination without abnormal findings: Secondary | ICD-10-CM | POA: Diagnosis not present

## 2017-07-05 DIAGNOSIS — G4733 Obstructive sleep apnea (adult) (pediatric): Secondary | ICD-10-CM | POA: Diagnosis not present

## 2017-07-05 MED ORDER — OMEPRAZOLE 40 MG PO CPDR: 40.0000 mg | DELAYED_RELEASE_CAPSULE | Freq: Every day | ORAL | 11 refills | Status: DC

## 2017-07-05 NOTE — Progress Notes (Signed)
Subjective:    Patient ID: Adam [name], male    DOB: 1966/01/01, 51 y.o.   MRN: [id number]  HPI Patient is here today for CPE.  Since last time saw the patient, he was diagnosed with obstructive sleep apnea with an apnea hypotony index of 30 per hour. He is currently on CPAP therapy and is being compliant with this. He also had a colonoscopy that was significant for tubular adenomas. He is scheduled for repeat colonoscopy in 2022. Otherwise he is been doing well with no concerns. He is taking nonsteroidal anti-inflammatory drugs 3-4 times a week. However he is also taking famotidine 45 times a week for heartburn. I suggested switching to a proton pump inhibitor given the fact he feels like he still having breakthrough heartburn and he feels like he is unable to discontinue the nonsteroidal anti-inflammatory drugs because of his back pain. Past Medical History:  Diagnosis Date  . GERD (gastroesophageal reflux disease)   . 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 OSA (obstructive sleep apnea) 09/06/2016   AHI-30   Past Surgical History:  Procedure Laterality Date  . DG 4TH DIGIT RIGHT HAND    . HERNIA REPAIR    . TONSILLECTOMY    . WISDOM TOOTH EXTRACTION     Current Outpatient Prescriptions on File Prior to Visit  Medication Sig Dispense Refill  . acetaminophen (TYLENOL) 500 MG tablet Take 500 mg by mouth every 6 (six) hours as needed.    . Ascorbic Acid (VITAMIN C) 1000 MG tablet Take 1,000 mg by mouth daily.    [name] b complex vitamins tablet Take 1 tablet by mouth daily.    . Cholecalciferol (VITAMIN D) 2000 units CAPS Take by mouth.    . famotidine (PEPCID) 20 MG tablet Take 20 mg by mouth daily. 4 - 5 times per week    . ibuprofen (ADVIL,MOTRIN) 600 MG tablet Take 600 mg by mouth every 6 (six) hours as needed.    [name] Lysine 500 MG TABS Take 500 mg by mouth as needed.    . meloxicam (MOBIC) 15 MG tablet Take 15 mg by mouth daily.    [name] OVER THE COUNTER MEDICATION Take 1  tablet by mouth daily. Multi Vitamin - [employer] Brand     No current facility-administered medications on file prior to visit.    No Known Allergies Social History   Social History  . Marital status: Married    Spouse name: N/A  . Number of children: N/A  . Years of education: N/A   Occupational History  . Not on file.   Social History Main Topics  . Smoking status: Never Smoker  . Smokeless tobacco: Never Used  . Alcohol use 0.0 oz/week     Comment: Very seldom - beer  . Drug use: No  . Sexual activity: Not on file   Other Topics Concern  . Not on file   Social History Narrative  . No narrative on file   Family History  Problem Relation Age of Onset  . Dementia Mother   . Atrial fibrillation Father   . Colon polyps Father   . COPD Maternal Grandfather   . Colon cancer Neg Hx   . Esophageal cancer Neg Hx   . Rectal cancer Neg Hx   . Stomach cancer Neg Hx       Review of Systems  All other systems reviewed and are negative.      Objective:  Physical Exam  Constitutional: He is oriented to person, place, and time. He appears well-developed and well-nourished. No distress.  HENT:  Head: Normocephalic and atraumatic.  Right Ear: External ear normal.  Left Ear: External ear normal.  Nose: Nose normal.  Mouth/Throat: Oropharynx is clear and moist. No oropharyngeal exudate.  Eyes: Pupils are equal, round, and reactive to light. Conjunctivae and EOM are normal. Right eye exhibits no discharge. Left eye exhibits no discharge. No scleral icterus.  Neck: Normal range of motion. Neck supple. No JVD present. No tracheal deviation present. No thyromegaly present.  Cardiovascular: Normal rate, regular rhythm, normal heart sounds and intact distal pulses.  Exam reveals no gallop and no friction rub.   No murmur heard. Pulmonary/Chest: Effort normal and breath sounds normal. No stridor. No respiratory distress. He has no wheezes. He has no rales. He exhibits no tenderness.   Abdominal: Soft. Bowel sounds are normal. He exhibits no distension and no mass. There is no tenderness. There is no rebound and no guarding. Hernia confirmed negative in the right inguinal area and confirmed negative in the left inguinal area.  Genitourinary: Testes normal. Cremasteric reflex is present.  Musculoskeletal: Normal range of motion. He exhibits no edema or tenderness.  Lymphadenopathy:    He has no cervical adenopathy.       Right: No inguinal adenopathy present.       Left: No inguinal adenopathy present.  Neurological: He is alert and oriented to person, place, and time. He has normal reflexes. No cranial nerve deficit. He exhibits normal muscle tone. Coordination normal.  Skin: Skin is warm. No rash noted. He is not diaphoretic. No erythema. No pallor.  Psychiatric: He has a normal mood and affect. His behavior is normal. Judgment and thought content normal.  Vitals reviewed.         Assessment & Plan:  Routine general medical examination at a health care facility   Physical exam is completely normal except for obesity. I recommended diet exercise and weight loss. I will check a CBC, CMP, fasting lipid panel, and a PSA.  Spent 5 minutes with the patient discussing healthy lifestyle changes trying to encourage a diet rich in fruits and vegetables, a lifestyle incorporating 30 minutes of aerobic exercise every day, and weight loss as a means to help maintain a longer life expectancy with a higher quality of life.

## 2017-07-06 ENCOUNTER — Other Ambulatory Visit: Payer: Self-pay | Admitting: Family Medicine

## 2017-07-06 DIAGNOSIS — K219 Gastro-esophageal reflux disease without esophagitis: Secondary | ICD-10-CM | POA: Insufficient documentation

## 2017-07-06 DIAGNOSIS — Z0000 Encounter for general adult medical examination without abnormal findings: Secondary | ICD-10-CM

## 2017-07-06 DIAGNOSIS — Z125 Encounter for screening for malignant neoplasm of prostate: Secondary | ICD-10-CM

## 2017-07-06 DIAGNOSIS — Z79899 Other long term (current) drug therapy: Secondary | ICD-10-CM

## 2017-07-07 ENCOUNTER — Other Ambulatory Visit: Payer: BLUE CROSS/BLUE SHIELD

## 2017-07-07 DIAGNOSIS — Z79899 Other long term (current) drug therapy: Secondary | ICD-10-CM | POA: Diagnosis not present

## 2017-07-07 DIAGNOSIS — K219 Gastro-esophageal reflux disease without esophagitis: Secondary | ICD-10-CM | POA: Diagnosis not present

## 2017-07-07 DIAGNOSIS — Z0000 Encounter for general adult medical examination without abnormal findings: Secondary | ICD-10-CM | POA: Diagnosis not present

## 2017-07-07 DIAGNOSIS — Z125 Encounter for screening for malignant neoplasm of prostate: Secondary | ICD-10-CM

## 2017-07-07 LAB — CBC WITH DIFFERENTIAL/PLATELET
BASOS ABS: 58 {cells}/uL (ref 0–200)
Basophils Relative: 1 %
EOS PCT: 4 %
Eosinophils Absolute: 232 cells/uL (ref 15–500)
HCT: 44.8 % (ref 38.5–50.0)
Hemoglobin: 15 g/dL (ref 13.0–17.0)
LYMPHS ABS: 2204 {cells}/uL (ref 850–3900)
Lymphocytes Relative: 38 %
MCH: 29.6 pg (ref 27.0–33.0)
MCHC: 33.5 g/dL (ref 32.0–36.0)
MCV: 88.5 fL (ref 80.0–100.0)
MPV: 9.7 fL (ref 7.5–12.5)
Monocytes Absolute: 580 cells/uL (ref 200–950)
Monocytes Relative: 10 %
NEUTROS PCT: 47 %
Neutro Abs: 2726 cells/uL (ref 1500–7800)
PLATELETS: 205 10*3/uL (ref 140–400)
RBC: 5.06 MIL/uL (ref 4.20–5.80)
RDW: 13.4 % (ref 11.0–15.0)
WBC: 5.8 10*3/uL (ref 3.8–10.8)

## 2017-07-08 LAB — COMPLETE METABOLIC PANEL WITH GFR
ALBUMIN: 4.1 g/dL (ref 3.6–5.1)
ALK PHOS: 64 U/L (ref 40–115)
ALT: 29 U/L (ref 9–46)
AST: 25 U/L (ref 10–35)
BILIRUBIN TOTAL: 0.4 mg/dL (ref 0.2–1.2)
BUN: 14 mg/dL (ref 7–25)
CALCIUM: 9.2 mg/dL (ref 8.6–10.3)
CHLORIDE: 102 mmol/L (ref 98–110)
CO2: 26 mmol/L (ref 20–32)
CREATININE: 1.13 mg/dL (ref 0.70–1.33)
GFR, EST AFRICAN AMERICAN: 87 mL/min (ref >=60)
GFR, Est Non African American: 75 mL/min (ref >=60)
Glucose, Bld: 90 mg/dL (ref 70–99)
Potassium: 4.2 mmol/L (ref 3.5–5.3)
Sodium: 141 mmol/L (ref 135–146)
TOTAL PROTEIN: 6.3 g/dL (ref 6.1–8.1)

## 2017-07-08 LAB — LIPID PANEL
CHOLESTEROL: 164 mg/dL (ref <200)
HDL: 46 mg/dL (ref >40)
LDL Cholesterol: 78 mg/dL (ref <100)
TRIGLYCERIDES: 200 mg/dL — AB (ref <150)
Total CHOL/HDL Ratio: 3.6 Ratio (ref <5.0)
VLDL: 40 mg/dL — ABNORMAL HIGH (ref <30)

## 2017-07-08 LAB — PSA: PSA: 1.5 ng/mL (ref <=4.0)

## 2017-07-11 ENCOUNTER — Encounter: Payer: Self-pay | Admitting: Family Medicine

## 2017-10-10 ENCOUNTER — Telehealth: Payer: Self-pay | Admitting: Family Medicine

## 2017-10-10 MED ORDER — OMEPRAZOLE 40 MG PO CPDR: 40.0000 mg | DELAYED_RELEASE_CAPSULE | Freq: Every day | ORAL | 11 refills | Status: DC

## 2017-10-10 NOTE — Telephone Encounter (Signed)
Submitted PA through [email or web address] and received approval. Pharm made aware.

## 2017-10-16 ENCOUNTER — Other Ambulatory Visit: Payer: Self-pay | Admitting: Family Medicine

## 2018-01-16 DIAGNOSIS — G4733 Obstructive sleep apnea (adult) (pediatric): Secondary | ICD-10-CM | POA: Diagnosis not present

## 2018-01-22 ENCOUNTER — Ambulatory Visit: Payer: BLUE CROSS/BLUE SHIELD | Admitting: Nurse Practitioner

## 2018-01-25 NOTE — Progress Notes (Signed)
Really quiet and she was a little  [facility]  PATIENT: Adam [name] DOB: 1966-01-30   REASON FOR VISIT: Follow-up for obstructive sleep apnea with CPAP HISTORY FROM: Patient   HISTORY OF PRESENT ILLNESS:UPDATE 3/18/2019CM [name], 52 year old male returns for follow-up with obstructive sleep apnea here for CPAP compliance.  Compliance data dated 12/30/2017 to 01/28/2018 shows greater than 4 hours at 90%.  Average usage 6 hours 16 minutes.  Set pressure 5-15 cm.  EPR level 3 AHI 3.8 leak 95th percentile at 7.6.  ESS 8 he just received new supplies.  He returns for reevaluation with no new complaints 3/5/18CDMr. Otterness had actually been evaluated for sleep apnea and was found in 2012 that he had sleep apnea, he was supposed to return for CPAP titration but in the meantime was transferred and his job took him to a different part of the [place], he moved to [place] and [place]. He works for [employer] 31 years.  He is now returned to [facility] and stated to his primary care physician that he still suffers from daytime fatigue, sleepiness overall her lack of energy. He is currently also suffering from some lower back pain, has completed physical therapy and has discussed with his primary care physician if injections may help this particular concern. He saw Dr. Dennard Schaumann on 08/04/2016 and mentioned that his wife is concerned because she her some snore loudly and sometimes to stop breathing. He indicated also that he was excessively daytime sleepy endorsing the Epworth sleepiness score at 13 points. He can fall easily asleep, if he is physically not active or mentally not stimulated.  He also suffers from some acid reflux and right-sided sciatica.  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  It'll take about 30-40 minutes to wind down to be ready to go  to sleep. He will retreat to the bedroom usually around 4- 5 AM. Usually he will sleep within 30 minutes or less, his bedroom is corneal, quiet and dark. He sleeps usually on his side, he usually uses one pillow for head support. The head of bed is not elevated. His wife has noted him to snore in any position. He usually has about 3 bathroom breaks each night, after which he  has no trouble going back to sleep. His wife homeschools the children ( 14, 35 and 45 years old), he can sleep through. He uses a fan.  He usually tries to arise by 11 AM, needs an alarm. He  feels still very tired at that time. His sleep has not been as refreshing and restoring. Occasionally he will have a dry mouth but he does not have morning headaches, no diaphoresis or palpitations were noted. Interval history from 03/05/2018CD, [name] had undergone a home sleep test dated 10/18/2016 which revealed moderate sleep apnea by an AHI of 30.8, the RDI was 33.0 there was no evidence of tachycardia or bradycardia arrhythmia. The time at or below oxygen desaturation of 89% was a total of 21 minutes and is considered minor. Based on this and his high degree of daytime sleepiness and was recommended to undergo CPAP therapy as it would treat both apnea, and snoring, reflected and the RDI. The patient has been fitted with an auto CPAP between 5 and 12 cm water and 3 cm EPR it appears that his 95th percentile pressure  is around 11 cm the residual AHI is 4.3 and could certainly be improved upon. His compliance is 87% was 5 hours and 52 minutes average user time. He endorsed today the Epworth sleepiness score at 11 points which is significantly lower than pre-CPAP. He still feels that he is not quite accustomed to the CPAP machine.  He is using a dream wear interface made by [employer]: Full 14 system review of systems performed and notable only for those listed, all others are neg:  Constitutional: neg    Cardiovascular: neg Ear/Nose/Throat: neg  Skin: neg Eyes: neg Respiratory: neg Gastroitestinal: neg  Hematology/Lymphatic: neg  Endocrine: neg Musculoskeletal:neg Allergy/Immunology: Allergies Neurological: neg Psychiatric: neg Sleep : Obstructive sleep apnea with CPAP   ALLERGIES: No Known Allergies  HOME MEDICATIONS: Outpatient Medications Prior to Visit  Medication Sig Dispense Refill  . Ascorbic Acid (VITAMIN C) 1000 MG tablet Take 1,000 mg by mouth daily.    [name] b complex vitamins tablet Take 1 tablet by mouth daily.    . Cholecalciferol (VITAMIN D) 2000 units CAPS Take by mouth.    . Lysine 500 MG TABS Take 500 mg by mouth as needed.    . meloxicam (MOBIC) 15 MG tablet TAKE 1 TABLET BY MOUTH EVERY DAY WITH FOOD 30 tablet 3  . omeprazole (PRILOSEC) 40 MG capsule Take 1 capsule (40 mg total) by mouth daily. 30 capsule 11  . OVER THE COUNTER MEDICATION Take 1 tablet by mouth daily. Multi Vitamin - [employer] Brand    . acetaminophen (TYLENOL) 500 MG tablet Take 500 mg by mouth every 6 (six) hours as needed.    [name] ibuprofen (ADVIL,MOTRIN) 600 MG tablet Take 600 mg by mouth every 6 (six) hours as needed.    . famotidine (PEPCID) 20 MG tablet Take 20 mg by mouth daily. 4 - 5 times per week     No facility-administered medications prior to visit.     PAST MEDICAL HISTORY: Past Medical History:  Diagnosis Date  . GERD (gastroesophageal reflux disease)   . 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 OSA (obstructive sleep apnea) 09/06/2016   AHI-30, CPAP    PAST SURGICAL HISTORY: Past Surgical History:  Procedure Laterality Date  . DG 4TH DIGIT RIGHT HAND    . HERNIA REPAIR    . TONSILLECTOMY    . WISDOM TOOTH EXTRACTION      FAMILY HISTORY: Family History  Problem Relation Age of Onset  . Dementia Mother   . Atrial fibrillation Father   . Colon polyps Father   . COPD Maternal Grandfather   . Colon cancer Neg Hx   . Esophageal cancer Neg Hx   .  Rectal cancer Neg Hx   . Stomach cancer Neg Hx     SOCIAL HISTORY: Social History   Socioeconomic History  . Marital status: Married    Spouse name: Not on file  . Number of children: Not on file  . Years of education: Not on file  . Highest education level: Not on file  Social Needs  . Financial resource strain: Not on file  . Food insecurity - worry: Not on file  . Food insecurity - inability: Not on file  . Transportation needs - medical: Not on file  . Transportation needs - non-medical: Not on file  Occupational History  . Not on file  Tobacco Use  . Smoking status: Never Smoker  . Smokeless tobacco: Never  Used  Substance and Sexual Activity  . Alcohol use: Yes    Alcohol/week: 0.0 oz    Comment: Very seldom - beer  . Drug use: No  . Sexual activity: Not on file  Other Topics Concern  . Not on file  Social History Narrative  . Not on file     PHYSICAL EXAM  Vitals:   01/29/18 1315  BP: (!) 148/82  Pulse: 71  Weight: (!) 303 lb (137.4 kg)   Body mass index is 39.98 kg/m.  Generalized: Well developed, obese male in no acute distress  Head: normocephalic and atraumatic,. Oropharynx benign  Neck: Supple,  Musculoskeletal: No deformity   Neurological examination   Mentation: Alert oriented to time, place, history taking. Attention span and concentration appropriate. Recent and remote memory intact.  Follows all commands speech and language fluent.   Cranial nerve II-XII: Pupils were equal round reactive to light extraocular movements were full, visual field were full on confrontational test. Facial sensation and strength were normal. hearing was intact to finger rubbing bilaterally. Uvula tongue midline. head turning and shoulder shrug were normal and symmetric.Tongue protrusion into cheek strength was normal. Motor: normal bulk and tone, full strength in the BUE, BLE, Sensory: normal and symmetric to light touch,   Coordination: finger-nose-finger,  heel-to-shin bilaterally, no dysmetria Gait and Station: Rising up from seated position without assistance, normal stance,  moderate stride, good arm swing, smooth turning, able to perform tiptoe, and heel walking without difficulty. Tandem gait is steady  DIAGNOSTIC DATA (LABS, IMAGING, TESTING) - I reviewed patient records, labs, notes, testing and imaging myself where available.  Lab Results  Component Value Date   WBC 5.8 07/07/2017   HGB 15.0 07/07/2017   HCT 44.8 07/07/2017   MCV 88.5 07/07/2017   PLT 205 07/07/2017      Component Value Date/Time   NA 141 07/07/2017 0810   K 4.2 07/07/2017 0810   CL 102 07/07/2017 0810   CO2 26 07/07/2017 0810   GLUCOSE 90 07/07/2017 0810   BUN 14 07/07/2017 0810   CREATININE 1.13 07/07/2017 0810   CALCIUM 9.2 07/07/2017 0810   PROT 6.3 07/07/2017 0810   ALBUMIN 4.1 07/07/2017 0810   AST 25 07/07/2017 0810   ALT 29 07/07/2017 0810   ALKPHOS 64 07/07/2017 0810   BILITOT 0.4 07/07/2017 0810   GFRNONAA 75 07/07/2017 0810   GFRAA 87 07/07/2017 0810   Lab Results  Component Value Date   CHOL 164 07/07/2017   HDL 46 07/07/2017   LDLCALC 78 07/07/2017   TRIG 200 (H) 07/07/2017   CHOLHDL 3.6 07/07/2017    ASSESSMENT AND PLAN  52 y.o. year old male  With  OSA (obstructive sleep apnea) (09/06/2016). here to follow-up for CPAP compliance.Data dated 12/30/2017 to 01/28/2018 shows greater than 4 hours at 90%.  Average usage 6 hours 16 minutes.  Set pressure 5-15 cm.  EPR level 3 AHI 3.8 leak 95th percentile at 7.6.  ESS 8    PLAN: CPAP compliance greater than 4 hours 90% reviewed data with patient Continue same settings Follow-up yearly and as needed Dennie Bible, [facility], [facility], APRN  [facility] [address], [place], [place] [address] [phone]

## 2018-01-28 ENCOUNTER — Encounter: Payer: Self-pay | Admitting: Nurse Practitioner

## 2018-01-29 ENCOUNTER — Ambulatory Visit (INDEPENDENT_AMBULATORY_CARE_PROVIDER_SITE_OTHER): Payer: BLUE CROSS/BLUE SHIELD | Admitting: Nurse Practitioner

## 2018-01-29 ENCOUNTER — Encounter (INDEPENDENT_AMBULATORY_CARE_PROVIDER_SITE_OTHER): Payer: Self-pay

## 2018-01-29 ENCOUNTER — Encounter: Payer: Self-pay | Admitting: Nurse Practitioner

## 2018-01-29 DIAGNOSIS — Z9989 Dependence on other enabling machines and devices: Secondary | ICD-10-CM

## 2018-01-29 DIAGNOSIS — G4733 Obstructive sleep apnea (adult) (pediatric): Secondary | ICD-10-CM | POA: Diagnosis not present

## 2018-01-29 NOTE — Progress Notes (Signed)
I agree with the assessment and plan as directed by NP .The patient is known to me .   Please include Mallampati and neck circumference in sleep physical exam, not needed are sensory, coordination or gait.    Adam Befort, MD

## 2018-01-29 NOTE — Patient Instructions (Signed)
CPAP compliance greater than 4 hours 90% Continue same settings Follow-up yearly and as needed

## 2018-02-13 ENCOUNTER — Other Ambulatory Visit: Payer: Self-pay | Admitting: Family Medicine

## 2018-07-14 ENCOUNTER — Other Ambulatory Visit: Payer: Self-pay | Admitting: Family Medicine

## 2018-08-06 DIAGNOSIS — G4733 Obstructive sleep apnea (adult) (pediatric): Secondary | ICD-10-CM | POA: Diagnosis not present

## 2018-09-24 ENCOUNTER — Encounter: Payer: BLUE CROSS/BLUE SHIELD | Admitting: Family Medicine

## 2018-10-02 ENCOUNTER — Ambulatory Visit (INDEPENDENT_AMBULATORY_CARE_PROVIDER_SITE_OTHER): Payer: BLUE CROSS/BLUE SHIELD | Admitting: Family Medicine

## 2018-10-02 ENCOUNTER — Encounter: Payer: Self-pay | Admitting: Family Medicine

## 2018-10-02 VITALS — BP 138/80 | HR 84 | Temp 98.1°F | Resp 14 | Ht 73.0 in | Wt 297.0 lb

## 2018-10-02 DIAGNOSIS — Z0000 Encounter for general adult medical examination without abnormal findings: Secondary | ICD-10-CM | POA: Diagnosis not present

## 2018-10-02 DIAGNOSIS — Z23 Encounter for immunization: Secondary | ICD-10-CM

## 2018-10-02 MED ORDER — ZOSTER VAC RECOMB ADJUVANTED 50 MCG/0.5ML IM SUSR: 0.5000 mL | Freq: Once | INTRAMUSCULAR | 1 refills | Status: AC

## 2018-10-02 NOTE — Progress Notes (Signed)
Subjective:    Patient ID: Adam [name], male    DOB: 08-Jun-1966, 52 y.o.   MRN: [id number]  HPI Patient is here today for CPE.  Patient had a colonoscopy in 2017 significant for tubular adenomas.  He is due for repeat colonoscopy in 2022.  He is due for a flu shot.  He is also due for the shingles vaccine.  He agrees to the flu shot today.  He has a history of obstructive sleep apnea and he is compliant with his CPAP.  He currently works at [employer] and has for more than 30 years.  This is her busy as time and therefore is extremely tired.  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  Otherwise he is doing well with no concerns.  He denies any insomnia or depression. Past Medical History:  Diagnosis Date  . GERD (gastroesophageal reflux disease)   . 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 OSA (obstructive sleep apnea) 09/06/2016   AHI-30, CPAP   Past Surgical History:  Procedure Laterality Date  . DG 4TH DIGIT RIGHT HAND    . HERNIA REPAIR    . TONSILLECTOMY    . WISDOM TOOTH EXTRACTION     Current Outpatient Medications on File Prior to Visit  Medication Sig Dispense Refill  . acetaminophen (TYLENOL) 500 MG tablet Take 500 mg by mouth every 6 (six) hours as needed.    . Ascorbic Acid (VITAMIN C) 1000 MG tablet Take 1,000 mg by mouth daily.    [name] b complex vitamins tablet Take 1 tablet by mouth daily.    . Cholecalciferol (VITAMIN D) 2000 units CAPS Take by mouth.    [name] ibuprofen (ADVIL,MOTRIN) 600 MG tablet Take 600 mg by mouth every 6 (six) hours as needed.    [name] Lysine 500 MG TABS Take 500 mg by mouth as needed.    . meloxicam (MOBIC) 15 MG tablet TAKE 1 TABLET BY MOUTH EVERY DAY WITH FOOD 90 tablet 3  . omeprazole (PRILOSEC) 40 MG capsule TAKE 1 CAPSULE BY MOUTH EVERY DAY 90 capsule 3  . OVER THE COUNTER MEDICATION Take 1 tablet by mouth daily. Multi Vitamin - [employer] Brand     No current facility-administered medications on file prior to visit.      No Known Allergies Social History   Socioeconomic History  . Marital status: Married    Spouse name: Not on file  . Number of children: Not on file  . Years of education: Not on file  . Highest education level: Not on file  Occupational History  . Not on file  Social Needs  . Financial resource strain: Not on file  . Food insecurity:    Worry: Not on file    Inability: Not on file  . Transportation needs:    Medical: Not on file    Non-medical: Not on file  Tobacco Use  . Smoking status: Never Smoker  . Smokeless tobacco: Never Used  Substance and Sexual Activity  . Alcohol use: Yes    Alcohol/week: 0.0 standard drinks    Comment: Very seldom - beer  . Drug use: No  . Sexual activity: Not on file  Lifestyle  . Physical activity:    Days per week: Not on file    Minutes per session: Not on file  . Stress: Not on file  Relationships  . Social connections:    Talks on phone: Not  on file    Gets together: Not on file    Attends religious service: Not on file    Active member of club or organization: Not on file    Attends meetings of clubs or organizations: Not on file    Relationship status: Not on file  . Intimate partner violence:    Fear of current or ex partner: Not on file    Emotionally abused: Not on file    Physically abused: Not on file    Forced sexual activity: Not on file  Other Topics Concern  . Not on file  Social History Narrative  . Not on file   Family History  Problem Relation Age of Onset  . Dementia Mother   . Atrial fibrillation Father   . Colon polyps Father   . COPD Maternal Grandfather   . Colon cancer Neg Hx   . Esophageal cancer Neg Hx   . Rectal cancer Neg Hx   . Stomach cancer Neg Hx       Review of Systems  All other systems reviewed and are negative.      Objective:   Physical Exam  Constitutional: He is oriented to person, place, and time. He appears well-developed and well-nourished. No distress.  HENT:  Head:  Normocephalic and atraumatic.  Right Ear: External ear normal.  Left Ear: External ear normal.  Nose: Nose normal.  Mouth/Throat: Oropharynx is clear and moist. No oropharyngeal exudate.  Eyes: Pupils are equal, round, and reactive to light. Conjunctivae and EOM are normal. Right eye exhibits no discharge. Left eye exhibits no discharge. No scleral icterus.  Neck: Normal range of motion. Neck supple. No JVD present. No tracheal deviation present. No thyromegaly present.  Cardiovascular: Normal rate, regular rhythm, normal heart sounds and intact distal pulses. Exam reveals no gallop and no friction rub.  No murmur heard. Pulmonary/Chest: Effort normal and breath sounds normal. No stridor. No respiratory distress. He has no wheezes. He has no rales. He exhibits no tenderness.  Abdominal: Soft. Bowel sounds are normal. He exhibits no distension and no mass. There is no tenderness. There is no rebound and no guarding.  Genitourinary: Cremasteric reflex is present.  Musculoskeletal: Normal range of motion. He exhibits no edema or tenderness.  Lymphadenopathy:    He has no cervical adenopathy.  Neurological: He is alert and oriented to person, place, and time. He has normal reflexes. No cranial nerve deficit. He exhibits normal muscle tone. Coordination normal.  Skin: Skin is warm. No rash noted. He is not diaphoretic. No erythema. No pallor.  Psychiatric: He has a normal mood and affect. His behavior is normal. Judgment and thought content normal.  Vitals reviewed.         Assessment & Plan:  General medical exam - Plan: CBC with Differential/Platelet, COMPLETE METABOLIC PANEL WITH GFR, Lipid panel, PSA    Physical exam is completely normal except for obesity. I recommended diet exercise and weight loss. I will check a CBC, CMP, fasting lipid panel, and a PSA.  Spent 5 minutes with the patient discussing healthy lifestyle changes trying to encourage a diet rich in fruits and vegetables, a  lifestyle incorporating 30 minutes of aerobic exercise every day, and weight loss as a means to help maintain a longer life expectancy with a higher quality of life.  Patient's colonoscopy is up-to-date and is not due again until 2022.  I did give the patient prescription for the shingles vaccine.  He received his flu shot already.

## 2018-10-02 NOTE — Addendum Note (Signed)
Addended by: Shary Decamp B on: 10/02/2018 02:48 PM   Modules accepted: Orders

## 2018-10-03 LAB — LIPID PANEL
CHOL/HDL RATIO: 5.3 (calc) — AB (ref <5.0)
Cholesterol: 223 mg/dL — ABNORMAL HIGH (ref <200)
HDL: 42 mg/dL (ref >40)
LDL CHOLESTEROL (CALC): 136 mg/dL — AB
Non-HDL Cholesterol (Calc): 181 mg/dL (calc) — ABNORMAL HIGH (ref <130)
TRIGLYCERIDES: 314 mg/dL — AB (ref <150)

## 2018-10-03 LAB — COMPLETE METABOLIC PANEL WITH GFR
AG Ratio: 1.7 (calc) (ref 1.0–2.5)
ALBUMIN MSPROF: 4.3 g/dL (ref 3.6–5.1)
ALT: 24 U/L (ref 9–46)
AST: 25 U/L (ref 10–35)
Alkaline phosphatase (APISO): 75 U/L (ref 40–115)
BUN: 13 mg/dL (ref 7–25)
CHLORIDE: 100 mmol/L (ref 98–110)
CO2: 28 mmol/L (ref 20–32)
Calcium: 9.5 mg/dL (ref 8.6–10.3)
Creat: 1.09 mg/dL (ref 0.70–1.33)
GFR, EST AFRICAN AMERICAN: 90 mL/min/{1.73_m2} (ref >=60)
GFR, EST NON AFRICAN AMERICAN: 78 mL/min/{1.73_m2} (ref >=60)
GLUCOSE: 93 mg/dL (ref 65–99)
Globulin: 2.5 g/dL (calc) (ref 1.9–3.7)
Potassium: 4.2 mmol/L (ref 3.5–5.3)
Sodium: 139 mmol/L (ref 135–146)
TOTAL PROTEIN: 6.8 g/dL (ref 6.1–8.1)
Total Bilirubin: 0.6 mg/dL (ref 0.2–1.2)

## 2018-10-03 LAB — CBC WITH DIFFERENTIAL/PLATELET
BASOS ABS: 70 {cells}/uL (ref 0–200)
Basophils Relative: 1 %
EOS PCT: 3.3 %
Eosinophils Absolute: 231 cells/uL (ref 15–500)
HCT: 46.5 % (ref 38.5–50.0)
Hemoglobin: 16 g/dL (ref 13.2–17.1)
Lymphs Abs: 1673 cells/uL (ref 850–3900)
MCH: 29.1 pg (ref 27.0–33.0)
MCHC: 34.4 g/dL (ref 32.0–36.0)
MCV: 84.7 fL (ref 80.0–100.0)
MONOS PCT: 10.4 %
MPV: 10.7 fL (ref 7.5–12.5)
NEUTROS ABS: 4298 {cells}/uL (ref 1500–7800)
NEUTROS PCT: 61.4 %
Platelets: 228 10*3/uL (ref 140–400)
RBC: 5.49 10*6/uL (ref 4.20–5.80)
RDW: 12.5 % (ref 11.0–15.0)
Total Lymphocyte: 23.9 %
WBC mixed population: 728 cells/uL (ref 200–950)
WBC: 7 10*3/uL (ref 3.8–10.8)

## 2018-10-03 LAB — PSA: PSA: 1.4 ng/mL (ref <=4.0)

## 2018-10-05 ENCOUNTER — Other Ambulatory Visit: Payer: Self-pay | Admitting: Family Medicine

## 2018-10-05 DIAGNOSIS — E785 Hyperlipidemia, unspecified: Secondary | ICD-10-CM

## 2018-10-05 DIAGNOSIS — Z79899 Other long term (current) drug therapy: Secondary | ICD-10-CM

## 2018-10-05 MED ORDER — FENOFIBRATE 160 MG PO TABS: 160.0000 mg | ORAL_TABLET | Freq: Every day | ORAL | 1 refills | Status: DC

## 2019-03-20 ENCOUNTER — Other Ambulatory Visit: Payer: BLUE CROSS/BLUE SHIELD

## 2019-03-20 ENCOUNTER — Other Ambulatory Visit: Payer: Self-pay

## 2019-03-20 DIAGNOSIS — E785 Hyperlipidemia, unspecified: Secondary | ICD-10-CM | POA: Diagnosis not present

## 2019-03-20 DIAGNOSIS — Z79899 Other long term (current) drug therapy: Secondary | ICD-10-CM

## 2019-03-21 LAB — CBC WITH DIFFERENTIAL/PLATELET
Absolute Monocytes: 570 cells/uL (ref 200–950)
Basophils Absolute: 68 cells/uL (ref 0–200)
Basophils Relative: 1.2 %
Eosinophils Absolute: 291 cells/uL (ref 15–500)
Eosinophils Relative: 5.1 %
HCT: 44 % (ref 38.5–50.0)
Hemoglobin: 14.9 g/dL (ref 13.2–17.1)
Lymphs Abs: 2035 cells/uL (ref 850–3900)
MCH: 29.4 pg (ref 27.0–33.0)
MCHC: 33.9 g/dL (ref 32.0–36.0)
MCV: 87 fL (ref 80.0–100.0)
MPV: 10.7 fL (ref 7.5–12.5)
Monocytes Relative: 10 %
Neutro Abs: 2736 cells/uL (ref 1500–7800)
Neutrophils Relative %: 48 %
Platelets: 243 10*3/uL (ref 140–400)
RBC: 5.06 10*6/uL (ref 4.20–5.80)
RDW: 12.1 % (ref 11.0–15.0)
Total Lymphocyte: 35.7 %
WBC: 5.7 10*3/uL (ref 3.8–10.8)

## 2019-03-21 LAB — LIPID PANEL
Cholesterol: 154 mg/dL (ref <200)
HDL: 51 mg/dL (ref >=40)
LDL Cholesterol (Calc): 84 mg/dL (calc)
Non-HDL Cholesterol (Calc): 103 mg/dL (calc) (ref <130)
Total CHOL/HDL Ratio: 3 (calc) (ref <5.0)
Triglycerides: 92 mg/dL (ref <150)

## 2019-03-21 LAB — COMPREHENSIVE METABOLIC PANEL
AG Ratio: 1.9 (calc) (ref 1.0–2.5)
ALT: 23 U/L (ref 9–46)
AST: 20 U/L (ref 10–35)
Albumin: 4.3 g/dL (ref 3.6–5.1)
Alkaline phosphatase (APISO): 43 U/L (ref 35–144)
BUN: 21 mg/dL (ref 7–25)
CO2: 28 mmol/L (ref 20–32)
Calcium: 9.7 mg/dL (ref 8.6–10.3)
Chloride: 106 mmol/L (ref 98–110)
Creat: 1.26 mg/dL (ref 0.70–1.33)
Globulin: 2.3 g/dL (calc) (ref 1.9–3.7)
Glucose, Bld: 104 mg/dL — ABNORMAL HIGH (ref 65–99)
Potassium: 4.7 mmol/L (ref 3.5–5.3)
Sodium: 142 mmol/L (ref 135–146)
Total Bilirubin: 0.4 mg/dL (ref 0.2–1.2)
Total Protein: 6.6 g/dL (ref 6.1–8.1)

## 2019-04-02 ENCOUNTER — Other Ambulatory Visit: Payer: Self-pay | Admitting: Family Medicine

## 2019-04-26 DIAGNOSIS — G4733 Obstructive sleep apnea (adult) (pediatric): Secondary | ICD-10-CM | POA: Diagnosis not present

## 2019-07-13 ENCOUNTER — Other Ambulatory Visit: Payer: Self-pay | Admitting: Family Medicine

## 2019-08-01 ENCOUNTER — Other Ambulatory Visit: Payer: Self-pay | Admitting: Family Medicine

## 2019-09-26 ENCOUNTER — Other Ambulatory Visit: Payer: Self-pay | Admitting: Family Medicine

## 2019-10-08 DIAGNOSIS — G4733 Obstructive sleep apnea (adult) (pediatric): Secondary | ICD-10-CM | POA: Diagnosis not present

## 2019-11-18 ENCOUNTER — Emergency Department (HOSPITAL_COMMUNITY): Payer: BC Managed Care – PPO

## 2019-11-18 ENCOUNTER — Ambulatory Visit (INDEPENDENT_AMBULATORY_CARE_PROVIDER_SITE_OTHER): Payer: BC Managed Care – PPO | Admitting: Family Medicine

## 2019-11-18 ENCOUNTER — Other Ambulatory Visit: Payer: Self-pay

## 2019-11-18 ENCOUNTER — Inpatient Hospital Stay (HOSPITAL_COMMUNITY)
Admission: EM | Admit: 2019-11-18 | Discharge: 2019-11-28 | DRG: 177 | Disposition: A | Discharge status: Home / Self Care | Payer: BC Managed Care – PPO | Attending: Internal Medicine | Admitting: Internal Medicine

## 2019-11-18 DIAGNOSIS — J9600 Acute respiratory failure, unspecified whether with hypoxia or hypercapnia: Secondary | ICD-10-CM | POA: Diagnosis present

## 2019-11-18 DIAGNOSIS — A0839 Other viral enteritis: Secondary | ICD-10-CM | POA: Diagnosis not present

## 2019-11-18 DIAGNOSIS — R0602 Shortness of breath: Secondary | ICD-10-CM | POA: Diagnosis not present

## 2019-11-18 DIAGNOSIS — E669 Obesity, unspecified: Secondary | ICD-10-CM | POA: Diagnosis present

## 2019-11-18 DIAGNOSIS — R0902 Hypoxemia: Secondary | ICD-10-CM | POA: Diagnosis not present

## 2019-11-18 DIAGNOSIS — E785 Hyperlipidemia, unspecified: Secondary | ICD-10-CM | POA: Diagnosis not present

## 2019-11-18 DIAGNOSIS — Z8371 Family history of colonic polyps: Secondary | ICD-10-CM

## 2019-11-18 DIAGNOSIS — J189 Pneumonia, unspecified organism: Secondary | ICD-10-CM

## 2019-11-18 DIAGNOSIS — J9601 Acute respiratory failure with hypoxia: Secondary | ICD-10-CM | POA: Diagnosis present

## 2019-11-18 DIAGNOSIS — Z6838 Body mass index (BMI) 38.0-38.9, adult: Secondary | ICD-10-CM

## 2019-11-18 DIAGNOSIS — I4510 Unspecified right bundle-branch block: Secondary | ICD-10-CM | POA: Diagnosis present

## 2019-11-18 DIAGNOSIS — Z825 Family history of asthma and other chronic lower respiratory diseases: Secondary | ICD-10-CM

## 2019-11-18 DIAGNOSIS — A09 Infectious gastroenteritis and colitis, unspecified: Secondary | ICD-10-CM | POA: Diagnosis not present

## 2019-11-18 DIAGNOSIS — R7989 Other specified abnormal findings of blood chemistry: Secondary | ICD-10-CM | POA: Diagnosis present

## 2019-11-18 DIAGNOSIS — G4700 Insomnia, unspecified: Secondary | ICD-10-CM | POA: Diagnosis present

## 2019-11-18 DIAGNOSIS — Z79899 Other long term (current) drug therapy: Secondary | ICD-10-CM | POA: Diagnosis not present

## 2019-11-18 DIAGNOSIS — J159 Unspecified bacterial pneumonia: Secondary | ICD-10-CM | POA: Diagnosis present

## 2019-11-18 DIAGNOSIS — R509 Fever, unspecified: Secondary | ICD-10-CM | POA: Diagnosis not present

## 2019-11-18 DIAGNOSIS — F419 Anxiety disorder, unspecified: Secondary | ICD-10-CM | POA: Diagnosis present

## 2019-11-18 DIAGNOSIS — Z791 Long term (current) use of non-steroidal anti-inflammatories (NSAID): Secondary | ICD-10-CM | POA: Diagnosis not present

## 2019-11-18 DIAGNOSIS — R030 Elevated blood-pressure reading, without diagnosis of hypertension: Secondary | ICD-10-CM | POA: Diagnosis present

## 2019-11-18 DIAGNOSIS — J1282 Pneumonia due to coronavirus disease 2019: Secondary | ICD-10-CM | POA: Diagnosis not present

## 2019-11-18 DIAGNOSIS — U071 COVID-19: Principal | ICD-10-CM | POA: Diagnosis present

## 2019-11-18 DIAGNOSIS — K219 Gastro-esophageal reflux disease without esophagitis: Secondary | ICD-10-CM | POA: Diagnosis not present

## 2019-11-18 DIAGNOSIS — N179 Acute kidney failure, unspecified: Secondary | ICD-10-CM | POA: Diagnosis present

## 2019-11-18 DIAGNOSIS — R040 Epistaxis: Secondary | ICD-10-CM | POA: Diagnosis not present

## 2019-11-18 DIAGNOSIS — G4733 Obstructive sleep apnea (adult) (pediatric): Secondary | ICD-10-CM | POA: Diagnosis not present

## 2019-11-18 MED ORDER — METRONIDAZOLE 500 MG PO TABS: 500.0000 mg | ORAL_TABLET | Freq: Two times a day (BID) | ORAL | 0 refills | Status: DC

## 2019-11-18 MED ORDER — SODIUM CHLORIDE 0.9 % IV SOLN
1000.0000 mL | INTRAVENOUS | Status: DC
  Administered 2019-11-19: 1000 mL via INTRAVENOUS

## 2019-11-18 MED ORDER — CIPROFLOXACIN HCL 500 MG PO TABS: 500.0000 mg | ORAL_TABLET | Freq: Two times a day (BID) | ORAL | 0 refills | Status: DC

## 2019-11-18 NOTE — ED Notes (Signed)
EDP ITomi Bamberger informed of pt and O2 sat

## 2019-11-18 NOTE — Progress Notes (Signed)
Subjective:    Patient ID: Adam [name], male    DOB: 1966/11/10, 54 y.o.   MRN: [id number]  HPI  Patient is being seen today as a telephone visit.  He consents to be seen via telephone.  Phone call began at 840.  Phone call concluded at 855.  Patient states that his diarrhea began Saturday, December 26.  The diarrhea worsened through Wednesday, December 30.  He lost his appetite.  He denies any bloody diarrhea.  However Thursday Friday Saturday and Sunday he has been battling a persistent fever of 101 202.  He continues to have diarrhea but he denies any bloody diarrhea or intense abdominal pain.  He does have some mild lower abdominal cramps.  He also has a runny nose and cough on occasion.  He has an appointment to get a Covid test tomorrow at a local [facility].  He is quarantined himself however he really has very few if any respiratory symptoms.  He denies any nausea or vomiting but he does continue to have poor appetite.  He states that he has lost approximately 14 pounds over the last week.  His wife is having to alternate ibuprofen and Tylenol every 8 hours just to control his fever.  He denies any chest pain or shortness of breath Past Medical History:  Diagnosis Date  . GERD (gastroesophageal reflux disease)   . 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 OSA (obstructive sleep apnea) 09/06/2016   AHI-30, CPAP   Past Surgical History:  Procedure Laterality Date  . DG 4TH DIGIT RIGHT HAND    . HERNIA REPAIR    . TONSILLECTOMY    . WISDOM TOOTH EXTRACTION     Current Outpatient Medications on File Prior to Visit  Medication Sig Dispense Refill  . Ascorbic Acid (VITAMIN C) 1000 MG tablet Take 1,000 mg by mouth daily.    [name] b complex vitamins tablet Take 1 tablet by mouth daily.    . Cholecalciferol (VITAMIN D) 2000 units CAPS Take by mouth.    . fenofibrate 160 MG tablet TAKE 1 TABLET BY MOUTH EVERY DAY 90 tablet 1  . Lysine 500 MG TABS Take 500 mg by mouth as needed.    .  meloxicam (MOBIC) 15 MG tablet TAKE 1 TABLET BY MOUTH EVERY DAY WITH FOOD 90 tablet 2  . omeprazole (PRILOSEC) 40 MG capsule TAKE 1 CAPSULE BY MOUTH EVERY DAY 90 capsule 3  . OVER THE COUNTER MEDICATION Take 1 tablet by mouth daily. Multi Vitamin - [employer] Brand     No current facility-administered medications on file prior to visit.   No Known Allergies Social History   Socioeconomic History  . Marital status: Married    Spouse name: Not on file  . Number of children: Not on file  . Years of education: Not on file  . Highest education level: Not on file  Occupational History  . Not on file  Tobacco Use  . Smoking status: Never Smoker  . Smokeless tobacco: Never Used  Substance and Sexual Activity  . Alcohol use: Yes    Alcohol/week: 0.0 standard drinks    Comment: Very seldom - beer  . Drug use: No  . Sexual activity: Not on file  Other Topics Concern  . Not on file  Social History Narrative  . Not on file   Social Determinants of Health   Financial Resource Strain:   . Difficulty of Paying Living Expenses: Not on  file  Food Insecurity:   . Worried About Charity fundraiser in the Last Year: Not on file  . Ran Out of Food in the Last Year: Not on file  Transportation Needs:   . Lack of Transportation (Medical): Not on file  . Lack of Transportation (Non-Medical): Not on file  Physical Activity:   . Days of Exercise per Week: Not on file  . Minutes of Exercise per Session: Not on file  Stress:   . Feeling of Stress : Not on file  Social Connections:   . Frequency of Communication with Friends and Family: Not on file  . Frequency of Social Gatherings with Friends and Family: Not on file  . Attends Religious Services: Not on file  . Active Member of Clubs or Organizations: Not on file  . Attends Archivist Meetings: Not on file  . Marital Status: Not on file  Intimate Partner Violence:   . Fear of Current or Ex-Partner: Not on file  . Emotionally Abused:  Not on file  . Physically Abused: Not on file  . Sexually Abused: Not on file    Review of Systems  All other systems reviewed and are negative.      Objective:   Physical Exam        Assessment & Plan:  Infectious colitis  Differential diagnosis includes viral gastroenteritis, possible COVID-19 infection, versus bacterial infectious colitis.  Given the fact the symptoms of been present now for more than a week and the symptoms are worsening and he is now developing fevers, I have recommended treating him as bacterial infectious colitis with Cipro 500 mg p.o. twice daily for 10 days and Flagyl 500 mg p.o. twice daily for 10 days.  This would cover colitis and diverticulitis.  Patient will also be tested for Covid and if Covid test returns positive we will discontinue antibiotics.  I have recommended that both he and his wife be quarantined.  I recommended that he use Imodium to help slow the diarrhea to avoid dehydration.  Of asked him to push Gatorade 3-4 large bottles a day.  He can continue to alternate ibuprofen and Tylenol for fever.  If his abdominal pain worsens or if he develops any shortness of breath, he is instructed to go to the emergency room.

## 2019-11-18 NOTE — ED Notes (Addendum)
Informed EDP Butler and Ilene/RN of pt Adam [name]. Pt very dyspneic walking from lobby to triage 7 Pt initially placed on 3 LPM via [place] and encouraged to deep breathe through his nose

## 2019-11-19 ENCOUNTER — Other Ambulatory Visit: Payer: Self-pay

## 2019-11-19 ENCOUNTER — Encounter (HOSPITAL_COMMUNITY): Payer: Self-pay

## 2019-11-19 DIAGNOSIS — N179 Acute kidney failure, unspecified: Secondary | ICD-10-CM | POA: Diagnosis present

## 2019-11-19 DIAGNOSIS — F419 Anxiety disorder, unspecified: Secondary | ICD-10-CM | POA: Diagnosis present

## 2019-11-19 DIAGNOSIS — U071 COVID-19: Principal | ICD-10-CM

## 2019-11-19 DIAGNOSIS — R7989 Other specified abnormal findings of blood chemistry: Secondary | ICD-10-CM | POA: Diagnosis present

## 2019-11-19 DIAGNOSIS — A0839 Other viral enteritis: Secondary | ICD-10-CM | POA: Diagnosis present

## 2019-11-19 DIAGNOSIS — R030 Elevated blood-pressure reading, without diagnosis of hypertension: Secondary | ICD-10-CM | POA: Diagnosis present

## 2019-11-19 DIAGNOSIS — E785 Hyperlipidemia, unspecified: Secondary | ICD-10-CM | POA: Diagnosis present

## 2019-11-19 DIAGNOSIS — G4700 Insomnia, unspecified: Secondary | ICD-10-CM | POA: Diagnosis present

## 2019-11-19 DIAGNOSIS — E669 Obesity, unspecified: Secondary | ICD-10-CM | POA: Diagnosis present

## 2019-11-19 DIAGNOSIS — Z8371 Family history of colonic polyps: Secondary | ICD-10-CM | POA: Diagnosis not present

## 2019-11-19 DIAGNOSIS — K219 Gastro-esophageal reflux disease without esophagitis: Secondary | ICD-10-CM | POA: Diagnosis not present

## 2019-11-19 DIAGNOSIS — G4733 Obstructive sleep apnea (adult) (pediatric): Secondary | ICD-10-CM | POA: Diagnosis present

## 2019-11-19 DIAGNOSIS — J9600 Acute respiratory failure, unspecified whether with hypoxia or hypercapnia: Secondary | ICD-10-CM | POA: Diagnosis not present

## 2019-11-19 DIAGNOSIS — J9601 Acute respiratory failure with hypoxia: Secondary | ICD-10-CM | POA: Diagnosis present

## 2019-11-19 DIAGNOSIS — Z79899 Other long term (current) drug therapy: Secondary | ICD-10-CM | POA: Diagnosis not present

## 2019-11-19 DIAGNOSIS — J159 Unspecified bacterial pneumonia: Secondary | ICD-10-CM | POA: Diagnosis present

## 2019-11-19 DIAGNOSIS — Z6838 Body mass index (BMI) 38.0-38.9, adult: Secondary | ICD-10-CM | POA: Diagnosis not present

## 2019-11-19 DIAGNOSIS — J1282 Pneumonia due to coronavirus disease 2019: Secondary | ICD-10-CM | POA: Diagnosis present

## 2019-11-19 DIAGNOSIS — R0902 Hypoxemia: Secondary | ICD-10-CM | POA: Diagnosis present

## 2019-11-19 DIAGNOSIS — Z825 Family history of asthma and other chronic lower respiratory diseases: Secondary | ICD-10-CM | POA: Diagnosis not present

## 2019-11-19 DIAGNOSIS — I4510 Unspecified right bundle-branch block: Secondary | ICD-10-CM | POA: Diagnosis present

## 2019-11-19 DIAGNOSIS — Z791 Long term (current) use of non-steroidal anti-inflammatories (NSAID): Secondary | ICD-10-CM | POA: Diagnosis not present

## 2019-11-19 DIAGNOSIS — R040 Epistaxis: Secondary | ICD-10-CM | POA: Diagnosis not present

## 2019-11-19 LAB — CBC
HCT: 39.1 % (ref 39.0–52.0)
Hemoglobin: 13.2 g/dL (ref 13.0–17.0)
MCH: 29.8 pg (ref 26.0–34.0)
MCHC: 33.8 g/dL (ref 30.0–36.0)
MCV: 88.3 fL (ref 80.0–100.0)
Platelets: 145 10*3/uL — ABNORMAL LOW (ref 150–400)
RBC: 4.43 MIL/uL (ref 4.22–5.81)
RDW: 12.5 % (ref 11.5–15.5)
WBC: 12.1 10*3/uL — ABNORMAL HIGH (ref 4.0–10.5)
nRBC: 0 % (ref 0.0–0.2)

## 2019-11-19 LAB — BRAIN NATRIURETIC PEPTIDE: B Natriuretic Peptide: 70.3 pg/mL (ref 0.0–100.0)

## 2019-11-19 LAB — CBC WITH DIFFERENTIAL/PLATELET
Abs Immature Granulocytes: 0.22 10*3/uL — ABNORMAL HIGH (ref 0.00–0.07)
Basophils Absolute: 0 10*3/uL (ref 0.0–0.1)
Basophils Relative: 0 %
Eosinophils Absolute: 0 10*3/uL (ref 0.0–0.5)
Eosinophils Relative: 0 %
HCT: 43.3 % (ref 39.0–52.0)
Hemoglobin: 13.9 g/dL (ref 13.0–17.0)
Immature Granulocytes: 2 %
Lymphocytes Relative: 3 %
Lymphs Abs: 0.4 10*3/uL — ABNORMAL LOW (ref 0.7–4.0)
MCH: 28.1 pg (ref 26.0–34.0)
MCHC: 32.1 g/dL (ref 30.0–36.0)
MCV: 87.7 fL (ref 80.0–100.0)
Monocytes Absolute: 0.3 10*3/uL (ref 0.1–1.0)
Monocytes Relative: 2 %
Neutro Abs: 12 10*3/uL — ABNORMAL HIGH (ref 1.7–7.7)
Neutrophils Relative %: 93 %
Platelets: 167 10*3/uL (ref 150–400)
RBC: 4.94 MIL/uL (ref 4.22–5.81)
RDW: 12.4 % (ref 11.5–15.5)
WBC: 13 10*3/uL — ABNORMAL HIGH (ref 4.0–10.5)
nRBC: 0 % (ref 0.0–0.2)

## 2019-11-19 LAB — COMPREHENSIVE METABOLIC PANEL
ALT: 26 U/L (ref 0–44)
AST: 39 U/L (ref 15–41)
Albumin: 3.4 g/dL — ABNORMAL LOW (ref 3.5–5.0)
Alkaline Phosphatase: 51 U/L (ref 38–126)
Anion gap: 14 (ref 5–15)
BUN: 13 mg/dL (ref 6–20)
CO2: 20 mmol/L — ABNORMAL LOW (ref 22–32)
Calcium: 8.7 mg/dL — ABNORMAL LOW (ref 8.9–10.3)
Chloride: 103 mmol/L (ref 98–111)
Creatinine, Ser: 1.4 mg/dL — ABNORMAL HIGH (ref 0.61–1.24)
GFR calc Af Amer: >60 mL/min (ref >60)
GFR calc non Af Amer: 57 mL/min — ABNORMAL LOW (ref >60)
Glucose, Bld: 146 mg/dL — ABNORMAL HIGH (ref 70–99)
Potassium: 3.7 mmol/L (ref 3.5–5.1)
Sodium: 137 mmol/L (ref 135–145)
Total Bilirubin: 0.5 mg/dL (ref 0.3–1.2)
Total Protein: 7.2 g/dL (ref 6.5–8.1)

## 2019-11-19 LAB — PROCALCITONIN
Procalcitonin: 0.46 ng/mL
Procalcitonin: 0.92 ng/mL

## 2019-11-19 LAB — CREATININE, SERUM
Creatinine, Ser: 1.23 mg/dL (ref 0.61–1.24)
GFR calc Af Amer: >60 mL/min (ref >60)
GFR calc non Af Amer: >60 mL/min (ref >60)

## 2019-11-19 LAB — LACTIC ACID, PLASMA
Lactic Acid, Venous: 2.7 mmol/L (ref 0.5–1.9)
Lactic Acid, Venous: 1 mmol/L (ref 0.5–1.9)

## 2019-11-19 LAB — SAMPLE TO BLOOD BANK

## 2019-11-19 LAB — C-REACTIVE PROTEIN: CRP: 34.8 mg/dL — ABNORMAL HIGH (ref <1.0)

## 2019-11-19 LAB — RESPIRATORY PANEL BY RT PCR (FLU A&B, COVID)
Influenza A by PCR: NEGATIVE
Influenza B by PCR: NEGATIVE
SARS Coronavirus 2 by RT PCR: POSITIVE — AB

## 2019-11-19 LAB — FERRITIN: Ferritin: 729 ng/mL — ABNORMAL HIGH (ref 24–336)

## 2019-11-19 LAB — TROPONIN I (HIGH SENSITIVITY)
Troponin I (High Sensitivity): 9 ng/L (ref <18)
Troponin I (High Sensitivity): 9 ng/L (ref <18)

## 2019-11-19 LAB — FIBRINOGEN: Fibrinogen: 709 mg/dL — ABNORMAL HIGH (ref 210–475)

## 2019-11-19 LAB — D-DIMER, QUANTITATIVE: D-Dimer, Quant: 0.87 ug/mL-FEU — ABNORMAL HIGH (ref 0.00–0.50)

## 2019-11-19 LAB — HIV ANTIBODY (ROUTINE TESTING W REFLEX): HIV Screen 4th Generation wRfx: NONREACTIVE

## 2019-11-19 LAB — LACTATE DEHYDROGENASE: LDH: 327 U/L — ABNORMAL HIGH (ref 98–192)

## 2019-11-19 LAB — TRIGLYCERIDES: Triglycerides: 103 mg/dL (ref <150)

## 2019-11-19 MED ORDER — DEXAMETHASONE SODIUM PHOSPHATE 10 MG/ML IJ SOLN
6.0000 mg | Freq: Every day | INTRAMUSCULAR | Status: DC
  Administered 2019-11-19 – 2019-11-20 (×2): 6 mg via INTRAVENOUS
  Filled 2019-11-19 (×2): qty 1

## 2019-11-19 MED ORDER — SODIUM CHLORIDE 0.9 % IV SOLN
1000.0000 mL | INTRAVENOUS | Status: DC
  Administered 2019-11-19: 1000 mL via INTRAVENOUS

## 2019-11-19 MED ORDER — SODIUM CHLORIDE 0.9 % IV SOLN
100.0000 mg | Freq: Every day | INTRAVENOUS | Status: AC
  Administered 2019-11-20 – 2019-11-23 (×4): 100 mg via INTRAVENOUS
  Filled 2019-11-19 (×3): qty 20

## 2019-11-19 MED ORDER — SODIUM CHLORIDE 0.9 % IV SOLN
2.0000 g | Freq: Once | INTRAVENOUS | Status: AC
  Administered 2019-11-19: 2 g via INTRAVENOUS
  Filled 2019-11-19: qty 20

## 2019-11-19 MED ORDER — ACETAMINOPHEN 325 MG PO TABS
650.0000 mg | ORAL_TABLET | Freq: Four times a day (QID) | ORAL | Status: DC | PRN
  Administered 2019-11-20: 650 mg via ORAL
  Filled 2019-11-19: qty 2

## 2019-11-19 MED ORDER — ENOXAPARIN SODIUM 60 MG/0.6ML ~~LOC~~ SOLN
60.0000 mg | Freq: Every day | SUBCUTANEOUS | Status: DC
  Administered 2019-11-19 – 2019-11-20 (×2): 60 mg via SUBCUTANEOUS
  Filled 2019-11-19 (×2): qty 0.6

## 2019-11-19 MED ORDER — SODIUM CHLORIDE 0.9 % IV SOLN
500.0000 mg | Freq: Once | INTRAVENOUS | Status: AC
  Administered 2019-11-19: 04:00:00 500 mg via INTRAVENOUS
  Filled 2019-11-19: qty 500

## 2019-11-19 MED ORDER — DM-GUAIFENESIN ER 30-600 MG PO TB12
1.0000 | ORAL_TABLET | Freq: Two times a day (BID) | ORAL | Status: DC
  Administered 2019-11-19 – 2019-11-28 (×18): 1 via ORAL
  Filled 2019-11-19 (×18): qty 1

## 2019-11-19 MED ORDER — SODIUM CHLORIDE 0.9 % IV SOLN: 500.0000 mg | INTRAVENOUS | Status: DC

## 2019-11-19 MED ORDER — ONDANSETRON HCL 4 MG PO TABS: 4.0000 mg | ORAL_TABLET | Freq: Four times a day (QID) | ORAL | Status: DC | PRN

## 2019-11-19 MED ORDER — SODIUM CHLORIDE 0.9 % IV SOLN
200.0000 mg | Freq: Once | INTRAVENOUS | Status: AC
  Administered 2019-11-19: 200 mg via INTRAVENOUS
  Filled 2019-11-19: qty 200

## 2019-11-19 MED ORDER — FENOFIBRATE 160 MG PO TABS
160.0000 mg | ORAL_TABLET | Freq: Every day | ORAL | Status: DC
  Administered 2019-11-19: 160 mg via ORAL
  Filled 2019-11-19: qty 1

## 2019-11-19 MED ORDER — TOCILIZUMAB 400 MG/20ML IV SOLN
800.0000 mg | Freq: Once | INTRAVENOUS | Status: AC
  Administered 2019-11-19: 07:00:00 800 mg via INTRAVENOUS
  Filled 2019-11-19: qty 40

## 2019-11-19 MED ORDER — SODIUM CHLORIDE 0.9 % IV SOLN: 2.0000 g | INTRAVENOUS | Status: DC

## 2019-11-19 MED ORDER — ALBUTEROL SULFATE HFA 108 (90 BASE) MCG/ACT IN AERS
2.0000 | INHALATION_SPRAY | RESPIRATORY_TRACT | Status: DC | PRN
  Administered 2019-11-19 – 2019-11-27 (×13): 2 via RESPIRATORY_TRACT
  Filled 2019-11-19: qty 6.7

## 2019-11-19 MED ORDER — PANTOPRAZOLE SODIUM 40 MG PO TBEC
40.0000 mg | DELAYED_RELEASE_TABLET | Freq: Every day | ORAL | Status: DC
  Administered 2019-11-20 – 2019-11-28 (×9): 40 mg via ORAL
  Filled 2019-11-19 (×9): qty 1

## 2019-11-19 MED ORDER — ONDANSETRON HCL 4 MG/2ML IJ SOLN: 4.0000 mg | Freq: Four times a day (QID) | INTRAMUSCULAR | Status: DC | PRN

## 2019-11-19 MED ORDER — IPRATROPIUM BROMIDE HFA 17 MCG/ACT IN AERS
2.0000 | INHALATION_SPRAY | RESPIRATORY_TRACT | Status: DC
  Administered 2019-11-19 – 2019-11-28 (×45): 2 via RESPIRATORY_TRACT
  Filled 2019-11-19: qty 12.9

## 2019-11-19 MED ORDER — HYDRALAZINE HCL 25 MG PO TABS
25.0000 mg | ORAL_TABLET | Freq: Four times a day (QID) | ORAL | Status: DC | PRN
  Filled 2019-11-19: qty 3

## 2019-11-19 MED ORDER — LOPERAMIDE HCL 2 MG PO CAPS
2.0000 mg | ORAL_CAPSULE | ORAL | Status: DC | PRN
  Administered 2019-11-19 – 2019-11-26 (×8): 2 mg via ORAL
  Filled 2019-11-19 (×9): qty 1

## 2019-11-19 MED ORDER — ACETAMINOPHEN 325 MG PO TABS
650.0000 mg | ORAL_TABLET | Freq: Once | ORAL | Status: AC
  Administered 2019-11-19: 650 mg via ORAL
  Filled 2019-11-19: qty 2

## 2019-11-19 MED ORDER — ACETAMINOPHEN 650 MG RE SUPP: 650.0000 mg | Freq: Four times a day (QID) | RECTAL | Status: DC | PRN

## 2019-11-19 NOTE — ED Triage Notes (Signed)
Pt coming from home c/o shortness of breath. Works for [employer] and has been exposed to [employer]

## 2019-11-19 NOTE — Progress Notes (Signed)
PROGRESS NOTE  Adam [name] [id number] DOB: [birth year]   PCP: Susy Frizzle, MD  Patient is from: Home  DOA: 11/18/2019 LOS: 0  Brief Narrative / Interim history: 54 year old male with history of HLD and OSA presenting with shortness of breath, diarrhea and dry cough.    In ED, hypoxic requiring 4 L by nasal cannula.  Febrile to 101.5.  COVID-19 positive.  CXR with right-sided infiltrate.  WBC 13.  CRP 34.8.  Procalcitonin 0.46.  BNP and high-sensitivity troponin was normal.  D-dimer 0.87.  EKG with RBBB.  Started on empiric antibiotic with ceftriaxone and azithromycin for possible bacterial pneumonia, and admitted for acute respiratory failure with hypoxia due to COVID-19 pneumonia. Started on Actemra, remdesivir and Decadron.  Subjective: No major events this morning.  Requiring 6 L by nasal cannula to maintain saturation in low 90s.  Still with some shortness of breath and dry cough.  Denies chest pain.  Denies nausea or vomiting.  Objective: Vitals:   11/19/19 1000 11/19/19 1030 11/19/19 1048 11/19/19 1130  BP: (!) 146/86 (!) 136/91 (!) 136/91 (!) 149/86  Pulse: 76 77 74 75  Resp: 16  15 (!) 24  Temp:      TempSrc:      SpO2: 91% 93% 92% 93%  Weight:      Height:        Intake/Output Summary (Last 24 hours) at 11/19/2019 1220 Last data filed at 11/19/2019 0943 Gross per 24 hour  Intake 528.12 ml  Output --  Net 528.12 ml   Filed Weights   11/18/19 2328  Weight: 131.5 kg    Examination:  GENERAL: No acute distress.  Appears well.  HEENT: MMM.  Vision and hearing grossly intact.  NECK: Supple.  No apparent JVD.  RESP:  No IWOB.  Fair aeration bilaterally CVS:  RRR. Heart sounds normal.  ABD/GI/GU: Bowel sounds present. Soft. Non tender.  MSK/EXT:  Moves extremities. No apparent deformity. No edema.  SKIN: no apparent skin lesion or wound NEURO: Awake, alert and oriented appropriately.  No apparent focal neuro deficit. PSYCH: Calm. Normal affect.    Procedures:  None  Assessment & Plan: Acute respiratory failure with hypoxia due to COVID-19 pneumonia: Requiring 6 L to maintain saturation in low 90s.  Inflammatory markers elevated.  Influenza swab negative.  Received ceftriaxone and azithromycin in ED. Recent Labs    11/18/19 2346  DDIMER 0.87*  FERRITIN 729*  LDH 327*  CRP 34.8*  -Received Actemra -Continue Decadron and remdesivir. -Inhalers, mucolytic's, antitussive and incentive spirometry. -Encourage proning as much as possible -Trend inflammatory markers and procalcitonin. -Follow blood cultures  Gastroenteritis due to COVID-19: Presented with diarrhea for 1 week. -Management as above  -Discontinue IV fluid-does not look dehydrated.  AKI: Could be due to GI losses/diarrhea.  Also on Mobic at home which could contribute. -Continue trending. -Avoid nephrotoxic meds.  HLD: -Discontinue fenofibrate and consider statin.  Elevated blood pressure without diagnosis of hypertension -As needed hydralazine.  OSA -As needed oxygen.  GERD: -Protonix               DVT prophylaxis: Subcu Lovenox Code Status: Full code Family Communication: Updated patient's wife over the phone. Disposition Plan: Remains inpatient.  Patient will be moved to [facility] when bed is available. Consultants: None   Microbiology summarized: COVID-19 positive. Influenza PCR negative. Blood culture pending.  Sch Meds:  Scheduled Meds: . dexamethasone (DECADRON) injection  6 mg Intravenous Daily  . dextromethorphan-guaiFENesin  1 tablet Oral BID  .  enoxaparin (LOVENOX) injection  60 mg Subcutaneous Daily  . ipratropium  2 puff Inhalation Q4H  . pantoprazole  40 mg Oral Daily   Continuous Infusions: . [START ON 11/20/2019] remdesivir 100 mg in NS 100 mL     PRN Meds:.acetaminophen **OR** acetaminophen, albuterol, hydrALAZINE, ondansetron **OR** ondansetron (ZOFRAN) IV  Antimicrobials: Anti-infectives (From admission, onward)    Start     Dose/Rate Route Frequency Ordered Stop   11/20/19 1000  remdesivir 100 mg in sodium chloride 0.9 % 100 mL IVPB     100 mg 200 mL/hr over 30 Minutes Intravenous Daily 11/19/19 0450 11/24/19 0959   11/19/19 0600  remdesivir 200 mg in sodium chloride 0.9% 250 mL IVPB     200 mg 580 mL/hr over 30 Minutes Intravenous Once 11/19/19 0450 11/19/19 0738   11/19/19 0315  cefTRIAXone (ROCEPHIN) 2 g in sodium chloride 0.9 % 100 mL IVPB     2 g 200 mL/hr over 30 Minutes Intravenous  Once 11/19/19 0305 11/19/19 0508   11/19/19 0315  azithromycin (ZITHROMAX) 500 mg in sodium chloride 0.9 % 250 mL IVPB     500 mg 250 mL/hr over 60 Minutes Intravenous  Once 11/19/19 0305 11/19/19 0508       I have personally reviewed the following labs and images: CBC: Recent Labs  Lab 11/18/19 2346 11/19/19 0631  WBC 13.0* 12.1*  NEUTROABS 12.0*  --   HGB 13.9 13.2  HCT 43.3 39.1  MCV 87.7 88.3  PLT 167 145*   BMP &GFR Recent Labs  Lab 11/18/19 2346 11/19/19 0631  NA 137  --   K 3.7  --   CL 103  --   CO2 20*  --   GLUCOSE 146*  --   BUN 13  --   CREATININE 1.40* 1.23  CALCIUM 8.7*  --    Estimated Creatinine Clearance: 98.7 mL/min (by C-G formula based on SCr of 1.23 mg/dL). Liver & Pancreas: Recent Labs  Lab 11/18/19 2346  AST 39  ALT 26  ALKPHOS 51  BILITOT 0.5  PROT 7.2  ALBUMIN 3.4*   No results for input(s): LIPASE, AMYLASE in the last 168 hours. No results for input(s): AMMONIA in the last 168 hours. Diabetic: No results for input(s): HGBA1C in the last 72 hours. No results for input(s): GLUCAP in the last 168 hours. Cardiac Enzymes: No results for input(s): CKTOTAL, CKMB, CKMBINDEX, TROPONINI in the last 168 hours. No results for input(s): PROBNP in the last 8760 hours. Coagulation Profile: No results for input(s): INR, PROTIME in the last 168 hours. Thyroid Function Tests: No results for input(s): TSH, T4TOTAL, FREET4, T3FREE, THYROIDAB in the last 72  hours. Lipid Profile: Recent Labs    11/18/19 2346  TRIG 103   Anemia Panel: Recent Labs    11/18/19 2346  FERRITIN 729*   Urine analysis: No results found for: COLORURINE, APPEARANCEUR, LABSPEC, PHURINE, GLUCOSEU, HGBUR, BILIRUBINUR, KETONESUR, PROTEINUR, UROBILINOGEN, NITRITE, LEUKOCYTESUR Sepsis Labs: Invalid input(s): PROCALCITONIN, [place]  Microbiology: Recent Results (from the past 240 hour(s))  Respiratory Panel by RT PCR (Flu A&B, Covid) - Nasopharyngeal Swab     Status: Abnormal   Collection Time: 11/18/19 11:42 PM   Specimen: Nasopharyngeal Swab  Result Value Ref Range Status   SARS Coronavirus 2 by RT PCR POSITIVE (A) NEGATIVE Final    Comment: CRITICAL RESULT CALLED TO, READ BACK BY AND VERIFIED WITH: FARRAR,S @ 0307 ON [id number] BY POTEAT,S (NOTE) SARS-CoV-2 target nucleic acids are DETECTED. SARS-CoV-2 RNA is generally  detectable in upper respiratory specimens  during the acute phase of infection. Positive results are indicative of the presence of the identified virus, but do not rule out bacterial infection or co-infection with other pathogens not detected by the test. Clinical correlation with patient history and other diagnostic information is necessary to determine patient infection status. The expected result is Negative. Fact Sheet for Patients:  PinkCheek.be Fact Sheet for Healthcare Providers: GravelBags.it This test is not yet approved or cleared by the [place] FDA and  has been authorized for detection and/or diagnosis of SARS-CoV-2 by FDA under an Emergency Use Authorization (EUA).  This EUA will remain in effect (meaning this test can  be used) for the duration of  the COVID-19 declaration under Section 564(b)(1) of the Act, 21 U.S.C. section 360bbb-3(b)(1), unless the authorization is terminated or revoked sooner.    Influenza A by PCR NEGATIVE NEGATIVE Final   Influenza B by  PCR NEGATIVE NEGATIVE Final    Comment: (NOTE) The Xpert Xpress SARS-CoV-2/FLU/RSV assay is intended as an aid in  the diagnosis of influenza from Nasopharyngeal swab specimens and  should not be used as a sole basis for treatment. Nasal washings and  aspirates are unacceptable for Xpert Xpress SARS-CoV-2/FLU/RSV  testing. Fact Sheet for Patients: PinkCheek.be Fact Sheet for Healthcare Providers: GravelBags.it This test is not yet approved or cleared by the [place] FDA and  has been authorized for detection and/or diagnosis of SARS-CoV-2 by  FDA under an Emergency Use Authorization (EUA). This EUA will remain  in effect (meaning this test can be used) for the duration of the  Covid-19 declaration under Section 564(b)(1) of the Act, 21  U.S.C. section 360bbb-3(b)(1), unless the authorization is  terminated or revoked. Performed at [facility] [place], [place] [address], [place], [place] [address]     Radiology Studies: [facility] Chest Port 1 View  Result Date: 11/19/2019 CLINICAL DATA:  Shortness of breath and fever EXAM: PORTABLE CHEST 1 VIEW COMPARISON:  None. FINDINGS: Increased opacity in the right mid lung. Normal cardiomediastinal contours. No pleural effusion pneumothorax. IMPRESSION: Increased opacity in the right mid lung, concerning for pneumonia. Electronically Signed   By: Ulyses Jarred M.D.   On: 11/19/2019 00:23    30 minutes with more than 50% spent in reviewing records, counseling patient/family and coordinating care.   [place] Gluth T. [place]  If 7PM-7AM, please contact night-coverage [email or web address] Password [email or web address] 11/19/2019, 12:20 PM

## 2019-11-19 NOTE — ED Notes (Signed)
Spoke to [facility]. ETA- "not too terribly long".

## 2019-11-19 NOTE — ED Notes (Signed)
Date and time results received: 11/19/19 1:46 AM   Test: lactic Critical Value: 2.7  Name of Provider Notified: Tomi Bamberger, MD

## 2019-11-19 NOTE — ED Notes (Signed)
Pt comfortable and has no further needs at this time. Call bell within reach

## 2019-11-19 NOTE — ED Notes (Signed)
Wife updated on plan of care with pt permisison. Wife would like to be contacted with any status changes.

## 2019-11-19 NOTE — Progress Notes (Signed)
Updated patient's wife over the phone. 

## 2019-11-19 NOTE — ED Notes (Addendum)
Updated patients wife on plan of care. Wife would like to drop off patients charger at [facility].   [facility] at bedside.

## 2019-11-19 NOTE — ED Notes (Addendum)
Pt placed on 6 L O2 n/c

## 2019-11-19 NOTE — ED Notes (Signed)
[facility] contacted and paperwork printed  

## 2019-11-19 NOTE — ED Notes (Signed)
Patient given breakfast tray and warm blanket.

## 2019-11-19 NOTE — ED Provider Notes (Signed)
[facility] DEPT Provider Note   CSN: [id number] Arrival date & time: 11/18/19  2226   Time seen 11:48 PM  History Chief Complaint  Patient presents with  . Shortness of Breath    Adam [name] is a 54 y.o. male.  HPI patient states he started having diarrhea on December 26, he states for the first 2 days it was "normal" diarrhea however on December 28 he started having mustard color diarrhea and would have 6-7 episodes a day.  He states he had decreased appetite and estimates he has lost 12 to 14 pounds since Christmas.  He denies abdominal pain but has had some mild nausea without vomiting.  He states his mouth has been dry the past couple of days.  He started feeling short of breath last night and was unable to wear his CPAP machine because he felt like he could not breathe out.  He started having fever either January 2 or 3 and states it was up to 102.8.  He denies sore throat, rhinorrhea, or headache.  He does describe a dry throat.  Patient does not smoke.  He states that he works for [employer] and prior to Thanksgiving they probably had 50 people out with Covid since it started, between Thanksgiving and Christmas they have had 59 people out and since Christmas there have been another 50 out.  He states he called his PCP who advised him he could take Imodium which she took this morning and has not had any diarrhea today.  He also was prescribed metronidazole for the diarrhea and he has had 1 pill today.  He states he has felt so bad he stopped taking all his regular medications.  PCP Susy Frizzle, MD      Past Medical History:  Diagnosis Date  . GERD (gastroesophageal reflux disease)   . 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 OSA (obstructive sleep apnea) 09/06/2016   AHI-30, CPAP    Patient Active Problem List   Diagnosis Date Noted  . Obstructive sleep apnea treated with continuous positive airway pressure (CPAP) 01/29/2018  .  GERD (gastroesophageal reflux disease) 07/06/2017  . 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 OSA (obstructive sleep apnea) 09/06/2016  . Lumbar pars defect     Past Surgical History:  Procedure Laterality Date  . DG 4TH DIGIT RIGHT HAND    . HERNIA REPAIR    . TONSILLECTOMY    . WISDOM TOOTH EXTRACTION         Family History  Problem Relation Age of Onset  . Dementia Mother   . Atrial fibrillation Father   . Colon polyps Father   . COPD Maternal Grandfather   . Colon cancer Neg Hx   . Esophageal cancer Neg Hx   . Rectal cancer Neg Hx   . Stomach cancer Neg Hx     Social History   Tobacco Use  . Smoking status: Never Smoker  . Smokeless tobacco: Never Used  Substance Use Topics  . Alcohol use: Yes    Alcohol/week: 0.0 standard drinks    Comment: Very seldom - beer  . Drug use: No  Employed  Home Medications Prior to Admission medications   Medication Sig Start Date End Date Taking? Authorizing Provider  Ascorbic Acid (VITAMIN C) 1000 MG tablet Take 1,000 mg by mouth 2 (two) times daily.    Yes [provider]  Cholecalciferol (VITAMIN D) 2000 units CAPS Take by mouth.   Yes [provider]  ciprofloxacin (CIPRO) 500 MG tablet Take 1 tablet (500 mg total) by mouth 2 (two) times daily. 11/18/19  Yes Susy Frizzle, MD  fenofibrate 160 MG tablet TAKE 1 TABLET BY MOUTH EVERY DAY 09/26/19  Yes Susy Frizzle, MD  meloxicam (MOBIC) 15 MG tablet TAKE 1 TABLET BY MOUTH EVERY DAY WITH FOOD Patient taking differently: Take 15 mg by mouth daily as needed for pain.  08/02/19  Yes Susy Frizzle, MD  metroNIDAZOLE (FLAGYL) 500 MG tablet Take 1 tablet (500 mg total) by mouth 2 (two) times daily. 11/18/19  Yes Susy Frizzle, MD  omeprazole (PRILOSEC) 40 MG capsule TAKE 1 CAPSULE BY MOUTH EVERY DAY 07/15/19  Yes Susy Frizzle, MD  OVER THE COUNTER MEDICATION Take 1 tablet by mouth daily.  Multi Vitamin - [employer] Brand   Yes [provider]    Allergies    Patient has no known allergies.  Review of Systems   Review of Systems  All other systems reviewed and are negative.   Physical Exam Updated Vital Signs BP (!) 144/76   Pulse 80   Temp (!) 101.5 F (38.6 C) (Oral)   Resp (!) 29   Ht 6\' 1"  (1.854 m)   Wt 131.5 kg   SpO2 94%   BMI 38.26 kg/m   Physical Exam Vitals and nursing note reviewed.  Constitutional:      General: He is not in acute distress.    Appearance: Normal appearance. He is well-developed. He is not ill-appearing or toxic-appearing.  HENT:     Head: Normocephalic and atraumatic.     Right Ear: External ear normal.     Left Ear: External ear normal.     Nose: Nose normal. No mucosal edema or rhinorrhea.     Mouth/Throat:     Mouth: Mucous membranes are dry.     Dentition: No dental abscesses.     Pharynx: No uvula swelling.  Eyes:     Extraocular Movements: Extraocular movements intact.     Conjunctiva/sclera: Conjunctivae normal.     Pupils: Pupils are equal, round, and reactive to light.  Cardiovascular:     Rate and Rhythm: Normal rate and regular rhythm.     Pulses: Normal pulses.     Heart sounds: Normal heart sounds. No murmur. No friction rub. No gallop.   Pulmonary:     Effort: Pulmonary effort is normal. No respiratory distress.     Breath sounds: Decreased air movement present. Decreased breath sounds present. No wheezing, rhonchi or rales.  Chest:     Chest wall: No tenderness or crepitus.  Abdominal:     General: Bowel sounds are normal. There is no distension.     Palpations: Abdomen is soft.     Tenderness: There is no abdominal tenderness. There is no guarding or rebound.  Musculoskeletal:        General: No tenderness. Normal range of motion.     Cervical back: Full passive range of motion without pain, normal range of motion and neck supple.     Comments: Moves all extremities well.   Skin:    General:  Skin is warm and dry.     Coloration: Skin is not pale.     Findings: No erythema or rash.  Neurological:     Mental Status: He is alert and oriented to person, place, and time.  Cranial Nerves: No cranial nerve deficit.  Psychiatric:        Mood and Affect: Mood is not anxious.        Speech: Speech normal.        Behavior: Behavior normal.     ED Results / Procedures / Treatments   Labs (all labs ordered are listed, but only abnormal results are displayed) Results for orders placed or performed during the hospital encounter of 11/18/19  Respiratory Panel by RT PCR (Flu A&B, Covid) - Nasopharyngeal Swab   Specimen: Nasopharyngeal Swab  Result Value Ref Range   SARS Coronavirus 2 by RT PCR POSITIVE (A) NEGATIVE   Influenza A by PCR NEGATIVE NEGATIVE   Influenza B by PCR NEGATIVE NEGATIVE  Lactic acid, plasma  Result Value Ref Range   Lactic Acid, Venous 2.7 (HH) 0.5 - 1.9 mmol/L  Lactic acid, plasma  Result Value Ref Range   Lactic Acid, Venous 1.0 0.5 - 1.9 mmol/L  CBC WITH DIFFERENTIAL  Result Value Ref Range   WBC 13.0 (H) 4.0 - 10.5 K/uL   RBC 4.94 4.22 - 5.81 MIL/uL   Hemoglobin 13.9 13.0 - 17.0 g/dL   HCT 43.3 39.0 - 52.0 %   MCV 87.7 80.0 - 100.0 fL   MCH 28.1 26.0 - 34.0 pg   MCHC 32.1 30.0 - 36.0 g/dL   RDW 12.4 11.5 - 15.5 %   Platelets 167 150 - 400 K/uL   nRBC 0.0 0.0 - 0.2 %   Neutrophils Relative % 93 %   Neutro Abs 12.0 (H) 1.7 - 7.7 K/uL   Lymphocytes Relative 3 %   Lymphs Abs 0.4 (L) 0.7 - 4.0 K/uL   Monocytes Relative 2 %   Monocytes Absolute 0.3 0.1 - 1.0 K/uL   Eosinophils Relative 0 %   Eosinophils Absolute 0.0 0.0 - 0.5 K/uL   Basophils Relative 0 %   Basophils Absolute 0.0 0.0 - 0.1 K/uL   Immature Granulocytes 2 %   Abs Immature Granulocytes 0.22 (H) 0.00 - 0.07 K/uL  Comprehensive metabolic panel  Result Value Ref Range   Sodium 137 135 - 145 mmol/L   Potassium 3.7 3.5 - 5.1 mmol/L   Chloride 103 98 - 111 mmol/L   CO2 20 (L) 22 -  32 mmol/L   Glucose, Bld 146 (H) 70 - 99 mg/dL   BUN 13 6 - 20 mg/dL   Creatinine, Ser 1.40 (H) 0.61 - 1.24 mg/dL   Calcium 8.7 (L) 8.9 - 10.3 mg/dL   Total Protein 7.2 6.5 - 8.1 g/dL   Albumin 3.4 (L) 3.5 - 5.0 g/dL   AST 39 15 - 41 U/L   ALT 26 0 - 44 U/L   Alkaline Phosphatase 51 38 - 126 U/L   Total Bilirubin 0.5 0.3 - 1.2 mg/dL   GFR calc non Af Amer 57 (L) >60 mL/min   GFR calc Af Amer >60 >60 mL/min   Anion gap 14 5 - 15  D-dimer, quantitative  Result Value Ref Range   D-Dimer, Quant 0.87 (H) 0.00 - 0.50 ug/mL-FEU  Procalcitonin  Result Value Ref Range   Procalcitonin 0.46 ng/mL  Lactate dehydrogenase  Result Value Ref Range   LDH 327 (H) 98 - 192 U/L  Ferritin  Result Value Ref Range   Ferritin 729 (H) 24 - 336 ng/mL  Triglycerides  Result Value Ref Range   Triglycerides 103 <150 mg/dL  Fibrinogen  Result Value Ref Range   Fibrinogen 709 (H) 210 -  475 mg/dL  C-reactive protein  Result Value Ref Range   CRP 34.8 (H) <1.0 mg/dL  Brain natriuretic peptide  Result Value Ref Range   B Natriuretic Peptide 70.3 0.0 - 100.0 pg/mL  Troponin I (High Sensitivity)  Result Value Ref Range   Troponin I (High Sensitivity) 9 <18 ng/L  Troponin I (High Sensitivity)  Result Value Ref Range   Troponin I (High Sensitivity) 9 <18 ng/L   Laboratory interpretation all normal except leukocytosis.  Elevated CRP, fibrinogen, ferritin and LDH.  Mildly elevated D-dimer.  Initial lactic acid was elevated but improved with IV fluids.Covid is positive with negative influenza test    EKG EKG Interpretation  Date/Time:  Monday November 18 2019 23:27:09 EST Ventricular Rate:  90 PR Interval:    QRS Duration: 150 QT Interval:  387 QTC Calculation: 474 R Axis:   -32 Text Interpretation: Sinus rhythm Right bundle branch block No old tracing to compare Confirmed by Aletta Edouard [phone]) on 11/18/2019 11:31:06 PM   Radiology DG Chest Port 1 View  Result Date: 11/19/2019 CLINICAL DATA:   Shortness of breath and fever EXAM: PORTABLE CHEST 1 VIEW COMPARISON:  None. FINDINGS: Increased opacity in the right mid lung. Normal cardiomediastinal contours. No pleural effusion pneumothorax. IMPRESSION: Increased opacity in the right mid lung, concerning for pneumonia. Electronically Signed   By: Ulyses Jarred M.D.   On: 11/19/2019 00:23    Procedures .Critical Care Performed by: Rolland Porter, MD Authorized by: Rolland Porter, MD   Critical care provider statement:    Critical care time (minutes):  31   Critical care was necessary to treat or prevent imminent or life-threatening deterioration of the following conditions:  Respiratory failure   Critical care was time spent personally by me on the following activities:  Discussions with consultants, examination of patient, obtaining history from patient or surrogate, ordering and review of laboratory studies, ordering and review of radiographic studies, pulse oximetry and re-evaluation of patient's condition   (including critical care time)  Medications Ordered in ED Medications  0.9 %  sodium chloride infusion (1,000 mLs Intravenous New Bag/Given 11/19/19 0024)  cefTRIAXone (ROCEPHIN) 1 g in sodium chloride 0.9 % 100 mL IVPB (has no administration in time range)  azithromycin (ZITHROMAX) 500 mg in sodium chloride 0.9 % 250 mL IVPB (has no administration in time range)  acetaminophen (TYLENOL) tablet 650 mg (has no administration in time range)    ED Course  I have reviewed the triage vital signs and the nursing notes.  Pertinent labs & imaging results that were available during my care of the patient were reviewed by me and considered in my medical decision making (see chart for details).  Patient was noted to have significant hypoxia in triage of 84% on room air.  He was placed on 3 L and it improved to 87% he then was increased to 4 L and it improved to 90% and at 6 L he maintains his pulse ox at 95% and he states he feels better.  Chest  x-ray shows a right sided pneumonia, he was started on community-acquired pneumonia antibiotics and his fever was treated with acetaminophen..  Covid laboratory testing was done for presumed Covid pneumonia.  We discussed he would need to be admitted due to his hypoxia and he is agreeable.  3:00 AM Dr. Hal Hope, hospitalist will admit   Sudarshan Delpino was evaluated in Emergency Department on 11/19/2019 for the symptoms described in the history of present illness. He was evaluated in  the context of the global COVID-19 pandemic, which necessitated consideration that the patient might be at risk for infection with the SARS-CoV-2 virus that causes COVID-19. Institutional protocols and algorithms that pertain to the evaluation of patients at risk for COVID-19 are in a state of rapid change based on information released by regulatory bodies including the CDC and federal and state organizations. These policies and algorithms were followed during the patient's care in the ED.     MDM Rules/Calculators/A&P                       Final Clinical Impression(s) / ED Diagnoses Final diagnoses:  Hypoxia  Community acquired pneumonia, unspecified laterality  COVID-19    Rx / DC Orders Plan admission  Rolland Porter, MD, Barbette Or, MD 11/19/19 [phone]

## 2019-11-19 NOTE — Progress Notes (Signed)
Adam [name]  [id number] DOB: 08-20-1966 DOA: 11/18/2019 PCP: Susy Frizzle, MD    Brief Narrative:  54 year old with a history of HLD and OSA who presented to the ED with complaints of 5 to 7 days of a diarrheal illness followed by the development of significant shortness of breath with a dry hacking cough.  In the ED he was found to be hypoxic and required 4 L nasal cannula support to keep saturations at 90% or better.  His temperature was 1015 and he was confirmed to be COVID-19 positive.  CXR noted pulmonary infiltrates.  He was admitted via the [facility] ED and then transferred to [facility].  Significant Events: 1/5 admit to [facility] via [facility]  COVID-19 specific Treatment: Remdesivir 1/5 > Decadron 1/5 > Actemra 1/5  Antimicrobials:  Rocephin 1/5 Azithromycin 1/5  Subjective: Pt seen for a f/u visit.   Assessment & Plan:  COVID Pneumonia -acute hypoxic respiratory failure  Recent Labs  Lab 11/18/19 2346  DDIMER 0.87*  FERRITIN 729*  CRP 34.8*  ALT 26  PROCALCITON 0.46    Covid gastroenteritis  Acute kidney injury  HLD  Elevated blood pressure  OSA  GERD  DVT prophylaxis: Lovenox Code Status: FULL CODE Family Communication:  Disposition Plan:   Consultants:  none  Objective: Blood pressure 122/64, pulse 76, temperature 99.8 F (37.7 C), temperature source Oral, resp. rate 18, height 6\' 1"  (1.854 m), weight 131.5 kg, SpO2 91 %.  Intake/Output Summary (Last 24 hours) at 11/19/2019 1738 Last data filed at 11/19/2019 0943 Gross per 24 hour  Intake 528.12 ml  Output --  Net 528.12 ml   Filed Weights   11/18/19 2328  Weight: 131.5 kg    Examination: F/u exam completed   CBC: Recent Labs  Lab 11/18/19 2346 11/19/19 0631  WBC 13.0* 12.1*  NEUTROABS 12.0*  --   HGB 13.9 13.2  HCT 43.3 39.1  MCV 87.7 88.3  PLT 167 [id number]*   Basic Metabolic Panel: Recent Labs  Lab 11/18/19 2346 11/19/19 0631  NA 137  --   K 3.7   --   CL 103  --   CO2 20*  --   GLUCOSE 146*  --   BUN 13  --   CREATININE 1.40* 1.23  CALCIUM 8.7*  --    GFR: Estimated Creatinine Clearance: 98.7 mL/min (by C-G formula based on SCr of 1.23 mg/dL).  Liver Function Tests: Recent Labs  Lab 11/18/19 2346  AST 39  ALT 26  ALKPHOS 51  BILITOT 0.5  PROT 7.2  ALBUMIN 3.4*    Recent Results (from the past 240 hour(s))  Respiratory Panel by RT PCR (Flu A&B, Covid) - Nasopharyngeal Swab     Status: Abnormal   Collection Time: 11/18/19 11:42 PM   Specimen: Nasopharyngeal Swab  Result Value Ref Range Status   SARS Coronavirus 2 by RT PCR POSITIVE (A) NEGATIVE Final    Comment: CRITICAL RESULT CALLED TO, READ BACK BY AND VERIFIED WITH: FARRAR,S @ 0307 ON [id number] BY POTEAT,S (NOTE) SARS-CoV-2 target nucleic acids are DETECTED. SARS-CoV-2 RNA is generally detectable in upper respiratory specimens  during the acute phase of infection. Positive results are indicative of the presence of the identified virus, but do not rule out bacterial infection or co-infection with other pathogens not detected by the test. Clinical correlation with patient history and other diagnostic information is necessary to determine patient infection status. The expected result is Negative. Fact Sheet for Patients:  PinkCheek.be Fact Sheet for Healthcare Providers: GravelBags.it This test is not yet approved or cleared by the [place] FDA and  has been authorized for detection and/or diagnosis of SARS-CoV-2 by FDA under an Emergency Use Authorization (EUA).  This EUA will remain in effect (meaning this test can  be used) for the duration of  the COVID-19 declaration under Section 564(b)(1) of the Act, 21 U.S.C. section 360bbb-3(b)(1), unless the authorization is terminated or revoked sooner.    Influenza A by PCR NEGATIVE NEGATIVE Final   Influenza B by PCR NEGATIVE NEGATIVE Final    Comment:  (NOTE) The Xpert Xpress SARS-CoV-2/FLU/RSV assay is intended as an aid in  the diagnosis of influenza from Nasopharyngeal swab specimens and  should not be used as a sole basis for treatment. Nasal washings and  aspirates are unacceptable for Xpert Xpress SARS-CoV-2/FLU/RSV  testing. Fact Sheet for Patients: PinkCheek.be Fact Sheet for Healthcare Providers: GravelBags.it This test is not yet approved or cleared by the [place] FDA and  has been authorized for detection and/or diagnosis of SARS-CoV-2 by  FDA under an Emergency Use Authorization (EUA). This EUA will remain  in effect (meaning this test can be used) for the duration of the  Covid-19 declaration under Section 564(b)(1) of the Act, 21  U.S.C. section 360bbb-3(b)(1), unless the authorization is  terminated or revoked. Performed at [facility], [place] [address] [place], [place] [address]      Scheduled Meds: . dexamethasone (DECADRON) injection  6 mg Intravenous Daily  . dextromethorphan-guaiFENesin  1 tablet Oral BID  . enoxaparin (LOVENOX) injection  60 mg Subcutaneous Daily  . ipratropium  2 puff Inhalation Q4H  . pantoprazole  40 mg Oral Daily   Continuous Infusions: . [START ON 11/20/2019] remdesivir 100 mg in NS 100 mL       LOS: 0 days   [name], MD [facility] Office  [phone] Pager - Text Page per Amion  If 7PM-7AM, please contact night-coverage per Amion 11/19/2019, 5:38 PM

## 2019-11-19 NOTE — Plan of Care (Signed)
  Problem: Education: Goal: Knowledge of risk factors and measures for prevention of condition will improve Outcome: Progressing   Problem: Coping: Goal: Psychosocial and spiritual needs will be supported Outcome: Progressing   Problem: Respiratory: Goal: Will maintain a patent airway Outcome: Progressing Goal: Complications related to the disease process, condition or treatment will be avoided or minimized Outcome: Progressing   

## 2019-11-19 NOTE — Progress Notes (Addendum)
Spoke on telephone with patient's spouse [phone]). Update given on patient condition. Spouse address concern related to Remdesivir. Confirmed that patient was correct; the therapy will be started tomorrow morning at 1000. He will received Remdesivir IV for 5 days, Spouse states that Remdesivir was started in ED at transferring hospital. Staff nurse confirms that patient did receive first dose (loading dose ) in [facility] ED. Informs that patient vital signs have remained stable and telephone call if patient condition change. Verbalized understanding.   MD Communication 11/20/19 @ 0247: On call provider notified r/t patient complaints of stuffy nose. Suggestion made for saline nasal spray   Family Update #2 01.06.21 @ 0604. Second telephone conversation with patient's spouse. Second update given. Disclosed that patient ran low grade fever of 100.6 and PRN dose of Tylenol was given. Most recent temperature of 99 (oral) given. Explained that patient also complained of nasal stuffiness and saline nasal spray ordered. Verbalized understanding

## 2019-11-19 NOTE — ED Notes (Signed)
Attempted to give report. RN unavailable.

## 2019-11-19 NOTE — ED Notes (Signed)
Adam [name], wife of patient called, [phone] and would like an update on this patient.

## 2019-11-19 NOTE — ED Notes (Signed)
Date and time results received: 11/19/19 3:06 AM  Test: covid Critical Value: POSITIVE   Name of Provider Notified: Tomi Bamberger, md

## 2019-11-19 NOTE — H&P (Signed)
History and Physical    Adam [name] [id number] DOB: 1966-09-01 DOA: 11/18/2019  PCP: Susy Frizzle, MD  Patient coming from: Home.  Chief Complaint: Shortness of breath.  HPI: Adam [name] is a 54 y.o. male with history of hyperlipidemia sleep apnea presented to the ER with complaint of shortness of breath.  Patient states he started having severe diarrhea over week ago.  Over the last 2 days patient has been clinically increasing short of breath with nonproductive cough and decided to come to the ER.  Denies chest pain.  Patient states many people in his workplace has been diagnosed with COVID-19 infection.  ED Course: In the ER patient is requiring 4 L oxygen with temperature of 101.5 chest x-ray shows right-sided infiltrates and labs show creatinine of 1.4 BNP 70.3 high-sensitivity troponin was negative CRP 34.8 procalcitonin 0.46 WBC count 13.  D-dimer was 0.87 EKG shows right bundle branch block.  Initially patient was started on empiric antibiotics for community-acquired pneumonia later patient's Covid test came back positive and patient is started on IV remdesivir Decadron and patient gave consent for starting Actemra after discussing with patient about the off label use of Actemra for COVID-19 infection also its side effects and contraindications.  Review of Systems: As per HPI, rest all negative.   Past Medical History:  Diagnosis Date  . GERD (gastroesophageal reflux disease)   . 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 OSA (obstructive sleep apnea) 09/06/2016   AHI-30, CPAP    Past Surgical History:  Procedure Laterality Date  . DG 4TH DIGIT RIGHT HAND    . HERNIA REPAIR    . TONSILLECTOMY    . WISDOM TOOTH EXTRACTION       reports that he has never smoked. He has never used smokeless tobacco. He reports current alcohol use. He reports that he does not use drugs.  No Known Allergies  Family History  Problem Relation Age of Onset  . Dementia  Mother   . Atrial fibrillation Father   . Colon polyps Father   . COPD Maternal Grandfather   . Colon cancer Neg Hx   . Esophageal cancer Neg Hx   . Rectal cancer Neg Hx   . Stomach cancer Neg Hx     Prior to Admission medications   Medication Sig Start Date End Date Taking? Authorizing Provider  Ascorbic Acid (VITAMIN C) 1000 MG tablet Take 1,000 mg by mouth 2 (two) times daily.    Yes [provider]  Cholecalciferol (VITAMIN D) 2000 units CAPS Take by mouth.   Yes [provider]  ciprofloxacin (CIPRO) 500 MG tablet Take 1 tablet (500 mg total) by mouth 2 (two) times daily. 11/18/19  Yes Susy Frizzle, MD  fenofibrate 160 MG tablet TAKE 1 TABLET BY MOUTH EVERY DAY 09/26/19  Yes Susy Frizzle, MD  meloxicam (MOBIC) 15 MG tablet TAKE 1 TABLET BY MOUTH EVERY DAY WITH FOOD Patient taking differently: Take 15 mg by mouth daily as needed for pain.  08/02/19  Yes Susy Frizzle, MD  metroNIDAZOLE (FLAGYL) 500 MG tablet Take 1 tablet (500 mg total) by mouth 2 (two) times daily. 11/18/19  Yes Susy Frizzle, MD  omeprazole (PRILOSEC) 40 MG capsule TAKE 1 CAPSULE BY MOUTH EVERY DAY 07/15/19  Yes Susy Frizzle, MD  OVER THE COUNTER MEDICATION Take 1 tablet by mouth daily. Multi Vitamin - [employer] Brand   Yes [provider]  Physical Exam: Constitutional: Moderately built and nourished. Vitals:   11/19/19 0330 11/19/19 0400 11/19/19 0430 11/19/19 0500  BP: 139/71 (!) 151/79 (!) 150/74 (!) 142/74  Pulse: 84 84 80 84  Resp: (!) 36 12 18 18   Temp:  100.1 F (37.8 C)    TempSrc:  Oral    SpO2: 93% 98% 95% 94%  Weight:      Height:       Eyes: Anicteric no pallor. ENMT: No discharge from the ears eyes nose or mouth. Neck: No mass felt.  No neck rigidity. Respiratory: No rhonchi or crepitations. Cardiovascular: S1-S2 heard. Abdomen: Soft nontender bowel sounds present. Musculoskeletal: No edema.  No joint effusion. Skin: No rash. Neurologic: Alert  awake oriented to time place and person.  Moves all extremities. Psychiatric: Appears normal per normal affect.   Labs on Admission: I have personally reviewed following labs and imaging studies  CBC: Recent Labs  Lab 11/18/19 2346  WBC 13.0*  NEUTROABS 12.0*  HGB 13.9  HCT 43.3  MCV 87.7  PLT [id number]   Basic Metabolic Panel: Recent Labs  Lab 11/18/19 2346  NA 137  K 3.7  CL 103  CO2 20*  GLUCOSE 146*  BUN 13  CREATININE 1.40*  CALCIUM 8.7*   GFR: Estimated Creatinine Clearance: 86.7 mL/min (A) (by C-G formula based on SCr of 1.4 mg/dL (H)). Liver Function Tests: Recent Labs  Lab 11/18/19 2346  AST 39  ALT 26  ALKPHOS 51  BILITOT 0.5  PROT 7.2  ALBUMIN 3.4*   No results for input(s): LIPASE, AMYLASE in the last 168 hours. No results for input(s): AMMONIA in the last 168 hours. Coagulation Profile: No results for input(s): INR, PROTIME in the last 168 hours. Cardiac Enzymes: No results for input(s): CKTOTAL, CKMB, CKMBINDEX, TROPONINI in the last 168 hours. BNP (last 3 results) No results for input(s): PROBNP in the last 8760 hours. HbA1C: No results for input(s): HGBA1C in the last 72 hours. CBG: No results for input(s): GLUCAP in the last 168 hours. Lipid Profile: Recent Labs    11/18/19 2346  TRIG 103   Thyroid Function Tests: No results for input(s): TSH, T4TOTAL, FREET4, T3FREE, THYROIDAB in the last 72 hours. Anemia Panel: Recent Labs    11/18/19 2346  FERRITIN 729*   Urine analysis: No results found for: COLORURINE, APPEARANCEUR, LABSPEC, PHURINE, GLUCOSEU, HGBUR, BILIRUBINUR, KETONESUR, PROTEINUR, UROBILINOGEN, NITRITE, LEUKOCYTESUR Sepsis Labs: @LABRCNTIP (procalcitonin:4,lacticidven:4) ) Recent Results (from the past 240 hour(s))  Respiratory Panel by RT PCR (Flu A&B, Covid) - Nasopharyngeal Swab     Status: Abnormal   Collection Time: 11/18/19 11:42 PM   Specimen: Nasopharyngeal Swab  Result Value Ref Range Status   SARS Coronavirus  2 by RT PCR POSITIVE (A) NEGATIVE Final    Comment: CRITICAL RESULT CALLED TO, READ BACK BY AND VERIFIED WITH: FARRAR,S @ 0307 ON [id number] BY POTEAT,S (NOTE) SARS-CoV-2 target nucleic acids are DETECTED. SARS-CoV-2 RNA is generally detectable in upper respiratory specimens  during the acute phase of infection. Positive results are indicative of the presence of the identified virus, but do not rule out bacterial infection or co-infection with other pathogens not detected by the test. Clinical correlation with patient history and other diagnostic information is necessary to determine patient infection status. The expected result is Negative. Fact Sheet for Patients:  PinkCheek.be Fact Sheet for Healthcare Providers: GravelBags.it This test is not yet approved or cleared by the [place] FDA and  has been authorized for detection and/or diagnosis of  SARS-CoV-2 by FDA under an Emergency Use Authorization (EUA).  This EUA will remain in effect (meaning this test can  be used) for the duration of  the COVID-19 declaration under Section 564(b)(1) of the Act, 21 U.S.C. section 360bbb-3(b)(1), unless the authorization is terminated or revoked sooner.    Influenza A by PCR NEGATIVE NEGATIVE Final   Influenza B by PCR NEGATIVE NEGATIVE Final    Comment: (NOTE) The Xpert Xpress SARS-CoV-2/FLU/RSV assay is intended as an aid in  the diagnosis of influenza from Nasopharyngeal swab specimens and  should not be used as a sole basis for treatment. Nasal washings and  aspirates are unacceptable for Xpert Xpress SARS-CoV-2/FLU/RSV  testing. Fact Sheet for Patients: PinkCheek.be Fact Sheet for Healthcare Providers: GravelBags.it This test is not yet approved or cleared by the [place] FDA and  has been authorized for detection and/or diagnosis of SARS-CoV-2 by  FDA under an  Emergency Use Authorization (EUA). This EUA will remain  in effect (meaning this test can be used) for the duration of the  Covid-19 declaration under Section 564(b)(1) of the Act, 21  U.S.C. section 360bbb-3(b)(1), unless the authorization is  terminated or revoked. Performed at [facility], [place] [address], [place], [place] [address]      Radiological Exams on Admission: DG Chest Port 1 View  Result Date: 11/19/2019 CLINICAL DATA:  Shortness of breath and fever EXAM: PORTABLE CHEST 1 VIEW COMPARISON:  None. FINDINGS: Increased opacity in the right mid lung. Normal cardiomediastinal contours. No pleural effusion pneumothorax. IMPRESSION: Increased opacity in the right mid lung, concerning for pneumonia. Electronically Signed   By: Ulyses Jarred M.D.   On: 11/19/2019 00:23    EKG: Independently reviewed.  Normal sinus rhythm with right bundle branch block.  Assessment/Plan Principal Problem:   Acute respiratory failure due to COVID-19 [facility]) Active Problems:   OSA (obstructive sleep apnea)   ARF (acute renal failure) ([place])    1. Acute respiratory failure with hypoxia secondary to COVID-19 infection presently on 4 L oxygen -start patient on IV Decadron and remdesivir after discussing with patient.  I also discussed with patient about Actemra and its off label use for COVID-19 infection and its side effects and contraindications.  Patient agrees to get it.  Follow respiratory status and inflammatory markers. 2. Acute renal failure -creatinine slightly elevated from baseline in May it was around 1.2 is 1.4.  Likely from diarrhea for which patient is receiving fluids.  Follow metabolic panel. 3. Elevated blood pressure readings closely follow blood pressure trends. 4. History of hyperlipidemia on fenofibrate. 5. History of sleep apnea on CPAP. 6. History of GERD. 7. Diarrhea likely related to COVID-19 infection.  Given the acute respiratory failure with hypoxia patient  required inpatient status.   DVT prophylaxis: Lovenox. Code Status: Full code. Family Communication: Discussed with patient. Disposition Plan: Home. Consults called: None. Admission status: Inpatient.   Rise Patience MD [facility] Pager [phone].  If 7PM-7AM, please contact night-coverage [email or web address] Password [facility]  11/19/2019, 5:33 AM

## 2019-11-19 NOTE — ED Notes (Signed)
Patient called out for water. Patient given water and crackers.

## 2019-11-20 LAB — COMPREHENSIVE METABOLIC PANEL
ALT: 39 U/L (ref 0–44)
AST: 53 U/L — ABNORMAL HIGH (ref 15–41)
Albumin: 2.9 g/dL — ABNORMAL LOW (ref 3.5–5.0)
Alkaline Phosphatase: 67 U/L (ref 38–126)
Anion gap: 12 (ref 5–15)
BUN: 16 mg/dL (ref 6–20)
CO2: 24 mmol/L (ref 22–32)
Calcium: 8.5 mg/dL — ABNORMAL LOW (ref 8.9–10.3)
Chloride: 103 mmol/L (ref 98–111)
Creatinine, Ser: 1.13 mg/dL (ref 0.61–1.24)
GFR calc Af Amer: >60 mL/min (ref >60)
GFR calc non Af Amer: >60 mL/min (ref >60)
Glucose, Bld: 96 mg/dL (ref 70–99)
Potassium: 4.1 mmol/L (ref 3.5–5.1)
Sodium: 139 mmol/L (ref 135–145)
Total Bilirubin: 0.6 mg/dL (ref 0.3–1.2)
Total Protein: 6.3 g/dL — ABNORMAL LOW (ref 6.5–8.1)

## 2019-11-20 LAB — CBC WITH DIFFERENTIAL/PLATELET
Abs Immature Granulocytes: 0.15 10*3/uL — ABNORMAL HIGH (ref 0.00–0.07)
Basophils Absolute: 0 10*3/uL (ref 0.0–0.1)
Basophils Relative: 0 %
Eosinophils Absolute: 0 10*3/uL (ref 0.0–0.5)
Eosinophils Relative: 0 %
HCT: 38.1 % — ABNORMAL LOW (ref 39.0–52.0)
Hemoglobin: 12.6 g/dL — ABNORMAL LOW (ref 13.0–17.0)
Immature Granulocytes: 2 %
Lymphocytes Relative: 6 %
Lymphs Abs: 0.5 10*3/uL — ABNORMAL LOW (ref 0.7–4.0)
MCH: 28.6 pg (ref 26.0–34.0)
MCHC: 33.1 g/dL (ref 30.0–36.0)
MCV: 86.6 fL (ref 80.0–100.0)
Monocytes Absolute: 0.3 10*3/uL (ref 0.1–1.0)
Monocytes Relative: 4 %
Neutro Abs: 7.7 10*3/uL (ref 1.7–7.7)
Neutrophils Relative %: 88 %
Platelets: 196 10*3/uL (ref 150–400)
RBC: 4.4 MIL/uL (ref 4.22–5.81)
RDW: 12.5 % (ref 11.5–15.5)
WBC: 8.7 10*3/uL (ref 4.0–10.5)
nRBC: 0 % (ref 0.0–0.2)

## 2019-11-20 LAB — FERRITIN: Ferritin: 873 ng/mL — ABNORMAL HIGH (ref 24–336)

## 2019-11-20 LAB — C-REACTIVE PROTEIN: CRP: 29.3 mg/dL — ABNORMAL HIGH (ref <1.0)

## 2019-11-20 LAB — D-DIMER, QUANTITATIVE: D-Dimer, Quant: 1.46 ug/mL-FEU — ABNORMAL HIGH (ref 0.00–0.50)

## 2019-11-20 LAB — PROCALCITONIN: Procalcitonin: 0.9 ng/mL

## 2019-11-20 MED ORDER — SODIUM CHLORIDE 0.9 % IV SOLN
2.0000 g | INTRAVENOUS | Status: DC
  Administered 2019-11-20 – 2019-11-23 (×4): 2 g via INTRAVENOUS
  Filled 2019-11-20 (×4): qty 20

## 2019-11-20 MED ORDER — ENOXAPARIN SODIUM 80 MG/0.8ML ~~LOC~~ SOLN
0.5000 mg/kg | Freq: Every day | SUBCUTANEOUS | Status: DC
  Administered 2019-11-21 – 2019-11-28 (×8): 65 mg via SUBCUTANEOUS
  Filled 2019-11-20 (×8): qty 0.8

## 2019-11-20 MED ORDER — DEXAMETHASONE SODIUM PHOSPHATE 10 MG/ML IJ SOLN
6.0000 mg | Freq: Two times a day (BID) | INTRAMUSCULAR | Status: DC
  Administered 2019-11-20 – 2019-11-23 (×6): 6 mg via INTRAVENOUS
  Filled 2019-11-20 (×6): qty 1

## 2019-11-20 MED ORDER — HYDROCOD POLST-CPM POLST ER 10-8 MG/5ML PO SUER
5.0000 mL | Freq: Two times a day (BID) | ORAL | Status: DC | PRN
  Administered 2019-11-20: 5 mL via ORAL
  Filled 2019-11-20: qty 5

## 2019-11-20 MED ORDER — BENZONATATE 100 MG PO CAPS: 100.0000 mg | ORAL_CAPSULE | Freq: Three times a day (TID) | ORAL | Status: DC | PRN

## 2019-11-20 MED ORDER — SALINE SPRAY 0.65 % NA SOLN
1.0000 | NASAL | Status: DC | PRN
  Administered 2019-11-24: 1 via NASAL
  Filled 2019-11-20: qty 44

## 2019-11-20 MED ORDER — SODIUM CHLORIDE 0.9 % IV SOLN
500.0000 mg | INTRAVENOUS | Status: DC
  Administered 2019-11-20 – 2019-11-23 (×4): 500 mg via INTRAVENOUS
  Filled 2019-11-20 (×4): qty 500

## 2019-11-20 NOTE — Progress Notes (Signed)
1500- wife called and updates given to her. Wife had many concerns and questions. All questions and concerns answered. MD will call patient's wife tomorrow.

## 2019-11-20 NOTE — Progress Notes (Addendum)
Spoke on telephone with patient's spouse [phone]). Update given on patient's condition. Explained to spouse that patient had received Remdesivir IV therapy today on day shift. Cough syrup has also been added to medication regimen. Explained that patient has been sitting up in chair for majority of day shift. Verbalized understanding.   Plan of Care Update 11/20/19 @ 0532: Update given to spouse's wife. Explained that patient has slept throughout the night. No significant changes noted at this time

## 2019-11-20 NOTE — Progress Notes (Signed)
PROGRESS NOTE  Adam [name]  [id number] DOB: 1966-04-26 DOA: 11/18/2019 PCP: Susy Frizzle, MD   Brief Narrative: 54 year old with a history of HLD and OSA who presented to [facility] 1/5 with a week of diarrhea and subsequent cough with dyspnea found to be hypoxic , febrile with bilateral infiltrates on CXR and positive SARS-CoV-2 testing. He was admitted to [facility] and received a dose of tocilizumab due to markedly elevated inflammatory markers (CRP 34.8). Due to right midlung zone infiltrate, ceftriaxone and azithromycin have been ordered, remdesivir and steroids are continued.   Assessment & Plan: Principal Problem:   Acute respiratory failure due to COVID-19 [facility]) Active Problems:   OSA (obstructive sleep apnea)   ARF (acute renal failure) ([facility])  Acute hypoxic respiratory failure due to covid-19 pneumonia, possible superimposed bacterial CAP: BNP wnl. Tn normal. Note somewhat delayed presentation as patient has been short of breath, taking off CPAP to breathe while sleeping for days. - Received tocilizumab 1/5.  - Continue remdesivir 1/5 - 1/9 - Continue decadron 1/5 - 1/14. Augment by giving additional dose tonight. - PCT has risen and is significantly elevated. While not specific, with predominantly RML infiltrate, will treat as RML CAP with ceftriaxone, azithromycin (reordered).  - Continue supplemental oxygen to maintain SpO2 >89%, normal respiratory effort.  - Prone, mobilize, IS, FV, monitor blood cultures. HIV NR.   AKI: Improving.  - Monitor  Obesity: BMI 38. Noted.   HLD:  - Continue statin  Covid-19 gastroenteritis:  - Symptomatic management  OSA:  - NIPPV if possible qHS  GERD:  - Continue PPI  DVT prophylaxis: Lovenox, augment to 0.5mg /kg dosing Code Status: Full Family Communication: None at bedside Disposition Plan: Uncertain  Consultants:   None  Procedures:   None  Antimicrobials:  Remdesivir, ceftriaxone, azithromycin 1/5 >>     Subjective: Feels improvement from admission in shortness of breath which is worse with exertion. No chest pain, leg swelling, orthopnea.   Objective: Vitals:   11/20/19 0802 11/20/19 1110 11/20/19 1330 11/20/19 1611  BP:    133/80  Pulse:      Resp: 18   16  Temp: 98.2 F (36.8 C) 98.4 F (36.9 C)  99.2 F (37.3 C)  TempSrc: Oral Oral  Oral  SpO2:  91% (!) 86%   Weight:      Height:        Intake/Output Summary (Last 24 hours) at 11/20/2019 1906 Last data filed at 11/20/2019 1800 Gross per 24 hour  Intake 480 ml  Output 200 ml  Net 280 ml   Filed Weights   11/18/19 2328  Weight: 131.5 kg    Gen: 54 y.o. male in no distress, pleasant Pulm: Non-labored breathing supplemental oxygen. Crackles diffusely bilaterally.  CV: Regular rate and rhythm. No murmur, rub, or gallop. No JVD, no pedal edema. GI: Abdomen soft, non-tender, non-distended, with normoactive bowel sounds. No organomegaly or masses felt. Ext: Warm, no deformities Skin: No rashes, lesions or ulcers Neuro: Alert and oriented. No focal neurological deficits. Psych: Judgement and insight appear normal. Mood & affect appropriate.   Data Reviewed: I have personally reviewed following labs and imaging studies  CBC: Recent Labs  Lab 11/18/19 2346 11/19/19 0631 11/20/19 0000  WBC 13.0* 12.1* 8.7  NEUTROABS 12.0*  --  7.7  HGB 13.9 13.2 12.6*  HCT 43.3 39.1 38.1*  MCV 87.7 88.3 86.6  PLT 167 145* [id number]   Basic Metabolic Panel: Recent Labs  Lab 11/18/19 2346 11/19/19 0631 11/20/19 0000  NA 137  --  139  K 3.7  --  4.1  CL 103  --  103  CO2 20*  --  24  GLUCOSE 146*  --  96  BUN 13  --  16  CREATININE 1.40* 1.23 1.13  CALCIUM 8.7*  --  8.5*   GFR: Estimated Creatinine Clearance: 107.5 mL/min (by C-G formula based on SCr of 1.13 mg/dL). Liver Function Tests: Recent Labs  Lab 11/18/19 2346 11/20/19 0000  AST 39 53*  ALT 26 39  ALKPHOS 51 67  BILITOT 0.5 0.6  PROT 7.2 6.3*  ALBUMIN 3.4* 2.9*    No results for input(s): LIPASE, AMYLASE in the last 168 hours. No results for input(s): AMMONIA in the last 168 hours. Coagulation Profile: No results for input(s): INR, PROTIME in the last 168 hours. Cardiac Enzymes: No results for input(s): CKTOTAL, CKMB, CKMBINDEX, TROPONINI in the last 168 hours. BNP (last 3 results) No results for input(s): PROBNP in the last 8760 hours. HbA1C: No results for input(s): HGBA1C in the last 72 hours. CBG: No results for input(s): GLUCAP in the last 168 hours. Lipid Profile: Recent Labs    11/18/19 2346  TRIG 103   Thyroid Function Tests: No results for input(s): TSH, T4TOTAL, FREET4, T3FREE, THYROIDAB in the last 72 hours. Anemia Panel: Recent Labs    11/18/19 2346 11/20/19 0000  FERRITIN 729* 873*   Urine analysis: No results found for: COLORURINE, APPEARANCEUR, LABSPEC, PHURINE, GLUCOSEU, HGBUR, BILIRUBINUR, KETONESUR, PROTEINUR, UROBILINOGEN, NITRITE, LEUKOCYTESUR Recent Results (from the past 240 hour(s))  Respiratory Panel by RT PCR (Flu A&B, Covid) - Nasopharyngeal Swab     Status: Abnormal   Collection Time: 11/18/19 11:42 PM   Specimen: Nasopharyngeal Swab  Result Value Ref Range Status   SARS Coronavirus 2 by RT PCR POSITIVE (A) NEGATIVE Final    Comment: CRITICAL RESULT CALLED TO, READ BACK BY AND VERIFIED WITH: FARRAR,S @ 0307 ON [id number] BY POTEAT,S (NOTE) SARS-CoV-2 target nucleic acids are DETECTED. SARS-CoV-2 RNA is generally detectable in upper respiratory specimens  during the acute phase of infection. Positive results are indicative of the presence of the identified virus, but do not rule out bacterial infection or co-infection with other pathogens not detected by the test. Clinical correlation with patient history and other diagnostic information is necessary to determine patient infection status. The expected result is Negative. Fact Sheet for Patients:  PinkCheek.be Fact Sheet for  Healthcare Providers: GravelBags.it This test is not yet approved or cleared by the [place] FDA and  has been authorized for detection and/or diagnosis of SARS-CoV-2 by FDA under an Emergency Use Authorization (EUA).  This EUA will remain in effect (meaning this test can  be used) for the duration of  the COVID-19 declaration under Section 564(b)(1) of the Act, 21 U.S.C. section 360bbb-3(b)(1), unless the authorization is terminated or revoked sooner.    Influenza A by PCR NEGATIVE NEGATIVE Final   Influenza B by PCR NEGATIVE NEGATIVE Final    Comment: (NOTE) The Xpert Xpress SARS-CoV-2/FLU/RSV assay is intended as an aid in  the diagnosis of influenza from Nasopharyngeal swab specimens and  should not be used as a sole basis for treatment. Nasal washings and  aspirates are unacceptable for Xpert Xpress SARS-CoV-2/FLU/RSV  testing. Fact Sheet for Patients: PinkCheek.be Fact Sheet for Healthcare Providers: GravelBags.it This test is not yet approved or cleared by the [place] FDA and  has been authorized for detection and/or diagnosis of SARS-CoV-2 by  FDA under  an Emergency Use Authorization (EUA). This EUA will remain  in effect (meaning this test can be used) for the duration of the  Covid-19 declaration under Section 564(b)(1) of the Act, 21  U.S.C. section 360bbb-3(b)(1), unless the authorization is  terminated or revoked. Performed at [facility], [place] [address] [place], [place] [address]   Blood Culture (routine x 2)     Status: None (Preliminary result)   Collection Time: 11/18/19 11:46 PM   Specimen: BLOOD  Result Value Ref Range Status   Specimen Description   Final    BLOOD LEFT ANTECUBITAL Performed at [place] [address], [place], [place] [address]    Special Requests   Final    BOTTLES DRAWN AEROBIC AND ANAEROBIC Blood  Culture adequate volume Performed at [place] [address] [place], [place] [address]    Culture   Final    NO GROWTH 1 DAY Performed at [facility], [place] [address] [place], [place] [address]    Report Status PENDING  Incomplete  Blood Culture (routine x 2)     Status: None (Preliminary result)   Collection Time: 11/18/19 11:51 PM   Specimen: BLOOD  Result Value Ref Range Status   Specimen Description   Final    BLOOD RIGHT ANTECUBITAL Performed at [place] [address], [place], [place] [address]    Special Requests   Final    BOTTLES DRAWN AEROBIC AND ANAEROBIC Blood Culture adequate volume Performed at [place] [address], [place], [place] [address]    Culture   Final    NO GROWTH 1 DAY Performed at [facility], [place] [address], [place], [place] [address]    Report Status PENDING  Incomplete      Radiology Studies: DG Chest Port 1 View  Result Date: 11/19/2019 CLINICAL DATA:  Shortness of breath and fever EXAM: PORTABLE CHEST 1 VIEW COMPARISON:  None. FINDINGS: Increased opacity in the right mid lung. Normal cardiomediastinal contours. No pleural effusion pneumothorax. IMPRESSION: Increased opacity in the right mid lung, concerning for pneumonia. Electronically Signed   By: Ulyses Jarred M.D.   On: 11/19/2019 00:23    Scheduled Meds:  dexamethasone (DECADRON) injection  6 mg Intravenous Daily   dextromethorphan-guaiFENesin  1 tablet Oral BID   enoxaparin (LOVENOX) injection  60 mg Subcutaneous Daily   ipratropium  2 puff Inhalation Q4H   pantoprazole  40 mg Oral Daily   Continuous Infusions:  azithromycin 500 mg (11/20/19 1743)   cefTRIAXone (ROCEPHIN)  IV Stopped (11/20/19 1708)   remdesivir 100 mg in NS 100 mL 100 mg (11/20/19 0841)     LOS: 1 day   Time spent: 35 minutes.  Patrecia Pour, MD [facility] [email or web address] 11/20/2019, 7:06 PM

## 2019-11-21 LAB — CBC WITH DIFFERENTIAL/PLATELET
Abs Immature Granulocytes: 0.19 10*3/uL — ABNORMAL HIGH (ref 0.00–0.07)
Basophils Absolute: 0.1 10*3/uL (ref 0.0–0.1)
Basophils Relative: 1 %
Eosinophils Absolute: 0 10*3/uL (ref 0.0–0.5)
Eosinophils Relative: 0 %
HCT: 43 % (ref 39.0–52.0)
Hemoglobin: 14.1 g/dL (ref 13.0–17.0)
Immature Granulocytes: 2 %
Lymphocytes Relative: 10 %
Lymphs Abs: 0.8 10*3/uL (ref 0.7–4.0)
MCH: 28.5 pg (ref 26.0–34.0)
MCHC: 32.8 g/dL (ref 30.0–36.0)
MCV: 86.9 fL (ref 80.0–100.0)
Monocytes Absolute: 0.4 10*3/uL (ref 0.1–1.0)
Monocytes Relative: 5 %
Neutro Abs: 6.5 10*3/uL (ref 1.7–7.7)
Neutrophils Relative %: 82 %
Platelets: 321 10*3/uL (ref 150–400)
RBC: 4.95 MIL/uL (ref 4.22–5.81)
RDW: 12.6 % (ref 11.5–15.5)
WBC: 8 10*3/uL (ref 4.0–10.5)
nRBC: 0 % (ref 0.0–0.2)

## 2019-11-21 LAB — COMPREHENSIVE METABOLIC PANEL
ALT: 47 U/L — ABNORMAL HIGH (ref 0–44)
AST: 68 U/L — ABNORMAL HIGH (ref 15–41)
Albumin: 3.3 g/dL — ABNORMAL LOW (ref 3.5–5.0)
Alkaline Phosphatase: 59 U/L (ref 38–126)
Anion gap: 14 (ref 5–15)
BUN: 19 mg/dL (ref 6–20)
CO2: 21 mmol/L — ABNORMAL LOW (ref 22–32)
Calcium: 8.7 mg/dL — ABNORMAL LOW (ref 8.9–10.3)
Chloride: 104 mmol/L (ref 98–111)
Creatinine, Ser: 1.21 mg/dL (ref 0.61–1.24)
GFR calc Af Amer: >60 mL/min (ref >60)
GFR calc non Af Amer: >60 mL/min (ref >60)
Glucose, Bld: 128 mg/dL — ABNORMAL HIGH (ref 70–99)
Potassium: 4.3 mmol/L (ref 3.5–5.1)
Sodium: 139 mmol/L (ref 135–145)
Total Bilirubin: 0.4 mg/dL (ref 0.3–1.2)
Total Protein: 7 g/dL (ref 6.5–8.1)

## 2019-11-21 LAB — C-REACTIVE PROTEIN: CRP: 14.7 mg/dL — ABNORMAL HIGH (ref <1.0)

## 2019-11-21 LAB — D-DIMER, QUANTITATIVE: D-Dimer, Quant: 1.13 ug/mL-FEU — ABNORMAL HIGH (ref 0.00–0.50)

## 2019-11-21 LAB — PROCALCITONIN: Procalcitonin: 0.46 ng/mL

## 2019-11-21 LAB — FERRITIN: Ferritin: 929 ng/mL — ABNORMAL HIGH (ref 24–336)

## 2019-11-21 NOTE — Progress Notes (Signed)
Ok to add stop date of 5d to ceftriaxone to coincide with azithromycin per Dr. Bonner Puna.  Onnie Boer, PharmD, BCIDP, AAHIVP, CPP Infectious Disease Pharmacist 11/21/2019 12:13 PM

## 2019-11-21 NOTE — Progress Notes (Signed)
PROGRESS NOTE  Adam [name]  [id number] DOB: 10/06/1966 DOA: 11/18/2019 PCP: Susy Frizzle, MD   Brief Narrative: 54 year old with a history of HLD and OSA who presented to [facility] 1/5 with a week of diarrhea and subsequent cough with dyspnea found to be hypoxic , febrile with bilateral infiltrates on CXR and positive SARS-CoV-2 testing. He was admitted to [facility] and received a dose of tocilizumab due to markedly elevated inflammatory markers (CRP 34.8). Due to right midlung zone infiltrate, ceftriaxone and azithromycin have been ordered, remdesivir and steroids are continued.   Assessment & Plan: Principal Problem:   Acute respiratory failure due to COVID-19 [facility]) Active Problems:   OSA (obstructive sleep apnea)   ARF (acute renal failure) (HCC)  Acute hypoxic respiratory failure due to covid-19 pneumonia, superimposed bacterial CAP: BNP wnl. Tn normal. Note somewhat delayed presentation as patient has been short of breath, taking off CPAP to breathe while sleeping for days. - Received tocilizumab 1/5.  - Continue remdesivir 1/5 - 1/9 - Continue decadron 1/5 - 1/14. Augmented 1/6, will continue q12h dosing given good clinical response thus far. - PCT responding to abx. While not specific, with predominantly RML infiltrate, will treat as RML CAP with ceftriaxone, azithromycin x5 days. - Continue supplemental oxygen to maintain SpO2 >89% and/or normal respiratory effort.  - Prone, mobilize, IS, FV, monitor blood cultures. HIV NR.   AKI: Improved.  - Monitor  Obesity: BMI 38. Noted.   HLD:  - Continue statin  LFT elevation: Very mild, due to viral infection +/- remdesivir.  - Continue daily monitoring while on remdesivir.  Covid-19 gastroenteritis:  - Symptomatic management  OSA:  - NIPPV if possible qHS  GERD:  - Continue PPI  DVT prophylaxis: Lovenox, augmented to 0.5mg /kg dosing Code Status: Full Family Communication: Wife by phone Disposition Plan: Likely to return home  +/- HH once hypoxia significantly improved and no sooner than 1/9.  Consultants:   None  Procedures:   None  Antimicrobials:  Remdesivir, ceftriaxone, azithromycin 1/5 - 1/9  Subjective: Shortness of breath is present at rest, worse with exertion, limiting to short ambulatory excursions. Using IS very frequently and coughing up yellow mucous mostly in the morning which improves dyspnea. No chest pain. No leg swelling.   Objective: Vitals:   11/21/19 0515 11/21/19 0600 11/21/19 0700 11/21/19 0729  BP: 127/87   128/75  Pulse:      Resp: (!) 27 20 20 20   Temp: 98.2 F (36.8 C)   98.6 F (37 C)  TempSrc: Oral   Oral  SpO2: 90%     Weight:      Height:        Intake/Output Summary (Last 24 hours) at 11/21/2019 1421 Last data filed at 11/20/2019 1800 Gross per 24 hour  Intake 590 ml  Output --  Net 590 ml   Filed Weights   11/18/19 2328  Weight: 131.5 kg   Gen: 54 y.o. male in no distress Pulm: Nonlabored but tachypneic on supplemental HFNC. Crackles diffusely. CV: Regular rate and rhythm. No murmur, rub, or gallop. No JVD, no dependent edema. GI: Abdomen soft, non-tender, non-distended, with normoactive bowel sounds.  Ext: Warm, no deformities Skin: No rashes, lesions or ulcers on visualized skin. Neuro: Alert and oriented. No focal neurological deficits. Psych: Judgement and insight appear fair. Mood euthymic & affect congruent. Behavior is appropriate.    Data Reviewed: I have personally reviewed following labs and imaging studies  CBC: Recent Labs  Lab 11/18/19 2346 11/19/19  0631 11/20/19 0000 11/21/19 0530  WBC 13.0* 12.1* 8.7 8.0  NEUTROABS 12.0*  --  7.7 6.5  HGB 13.9 13.2 12.6* 14.1  HCT 43.3 39.1 38.1* 43.0  MCV 87.7 88.3 86.6 86.9  PLT 167 145* 196 [id number]   Basic Metabolic Panel: Recent Labs  Lab 11/18/19 2346 11/19/19 0631 11/20/19 0000 11/21/19 0530  NA 137  --  139 139  K 3.7  --  4.1 4.3  CL 103  --  103 104  CO2 20*  --  24 21*  GLUCOSE  146*  --  96 128*  BUN 13  --  16 19  CREATININE 1.40* 1.23 1.13 1.21  CALCIUM 8.7*  --  8.5* 8.7*   GFR: Estimated Creatinine Clearance: 100.4 mL/min (by C-G formula based on SCr of 1.21 mg/dL). Liver Function Tests: Recent Labs  Lab 11/18/19 2346 11/20/19 0000 11/21/19 0530  AST 39 53* 68*  ALT 26 39 47*  ALKPHOS 51 67 59  BILITOT 0.5 0.6 0.4  PROT 7.2 6.3* 7.0  ALBUMIN 3.4* 2.9* 3.3*   No results for input(s): LIPASE, AMYLASE in the last 168 hours. No results for input(s): AMMONIA in the last 168 hours. Coagulation Profile: No results for input(s): INR, PROTIME in the last 168 hours. Cardiac Enzymes: No results for input(s): CKTOTAL, CKMB, CKMBINDEX, TROPONINI in the last 168 hours. BNP (last 3 results) No results for input(s): PROBNP in the last 8760 hours. HbA1C: No results for input(s): HGBA1C in the last 72 hours. CBG: No results for input(s): GLUCAP in the last 168 hours. Lipid Profile: Recent Labs    11/18/19 2346  TRIG 103   Thyroid Function Tests: No results for input(s): TSH, T4TOTAL, FREET4, T3FREE, THYROIDAB in the last 72 hours. Anemia Panel: Recent Labs    11/20/19 0000 11/21/19 0530  FERRITIN 873* 929*   Urine analysis: No results found for: COLORURINE, APPEARANCEUR, LABSPEC, PHURINE, GLUCOSEU, HGBUR, BILIRUBINUR, KETONESUR, PROTEINUR, UROBILINOGEN, NITRITE, LEUKOCYTESUR Recent Results (from the past 240 hour(s))  Respiratory Panel by RT PCR (Flu A&B, Covid) - Nasopharyngeal Swab     Status: Abnormal   Collection Time: 11/18/19 11:42 PM   Specimen: Nasopharyngeal Swab  Result Value Ref Range Status   SARS Coronavirus 2 by RT PCR POSITIVE (A) NEGATIVE Final    Comment: CRITICAL RESULT CALLED TO, READ BACK BY AND VERIFIED WITH: FARRAR,S @ 0307 ON [id number] BY POTEAT,S (NOTE) SARS-CoV-2 target nucleic acids are DETECTED. SARS-CoV-2 RNA is generally detectable in upper respiratory specimens  during the acute phase of infection. Positive results  are indicative of the presence of the identified virus, but do not rule out bacterial infection or co-infection with other pathogens not detected by the test. Clinical correlation with patient history and other diagnostic information is necessary to determine patient infection status. The expected result is Negative. Fact Sheet for Patients:  PinkCheek.be Fact Sheet for Healthcare Providers: GravelBags.it This test is not yet approved or cleared by the [place] FDA and  has been authorized for detection and/or diagnosis of SARS-CoV-2 by FDA under an Emergency Use Authorization (EUA).  This EUA will remain in effect (meaning this test can  be used) for the duration of  the COVID-19 declaration under Section 564(b)(1) of the Act, 21 U.S.C. section 360bbb-3(b)(1), unless the authorization is terminated or revoked sooner.    Influenza A by PCR NEGATIVE NEGATIVE Final   Influenza B by PCR NEGATIVE NEGATIVE Final    Comment: (NOTE) The Xpert Xpress SARS-CoV-2/FLU/RSV assay is intended  as an aid in  the diagnosis of influenza from Nasopharyngeal swab specimens and  should not be used as a sole basis for treatment. Nasal washings and  aspirates are unacceptable for Xpert Xpress SARS-CoV-2/FLU/RSV  testing. Fact Sheet for Patients: PinkCheek.be Fact Sheet for Healthcare Providers: GravelBags.it This test is not yet approved or cleared by the [place] FDA and  has been authorized for detection and/or diagnosis of SARS-CoV-2 by  FDA under an Emergency Use Authorization (EUA). This EUA will remain  in effect (meaning this test can be used) for the duration of the  Covid-19 declaration under Section 564(b)(1) of the Act, 21  U.S.C. section 360bbb-3(b)(1), unless the authorization is  terminated or revoked. Performed at [facility], [place] [address], [place], [place] [address]   Blood Culture (routine x 2)     Status: None (Preliminary result)   Collection Time: 11/18/19 11:46 PM   Specimen: BLOOD  Result Value Ref Range Status   Specimen Description   Final    BLOOD LEFT ANTECUBITAL Performed at [facility] [address], [place], [place] [address]    Special Requests   Final    BOTTLES DRAWN AEROBIC AND ANAEROBIC Blood Culture adequate volume Performed at [place] [address], [place], [place] [address]    Culture   Final    NO GROWTH 2 DAYS Performed at [facility] [place] [address] [place], [place] [address]    Report Status PENDING  Incomplete  Blood Culture (routine x 2)     Status: None (Preliminary result)   Collection Time: 11/18/19 11:51 PM   Specimen: BLOOD  Result Value Ref Range Status   Specimen Description   Final    BLOOD RIGHT ANTECUBITAL Performed at [facility] [address], [place], [place] [address]    Special Requests   Final    BOTTLES DRAWN AEROBIC AND ANAEROBIC Blood Culture adequate volume Performed at [facility] [address] [place], [place] [address]    Culture   Final    NO GROWTH 2 DAYS Performed at [facility] [address], [place], [place] [address]    Report Status PENDING  Incomplete      Radiology Studies: No results found.  Scheduled Meds: . dexamethasone (DECADRON) injection  6 mg Intravenous Q12H  . dextromethorphan-guaiFENesin  1 tablet Oral BID  . enoxaparin (LOVENOX) injection  0.5 mg/kg Subcutaneous Daily  . ipratropium  2 puff Inhalation Q4H  . pantoprazole  40 mg Oral Daily   Continuous Infusions: . azithromycin 500 mg (11/20/19 1743)  . cefTRIAXone (ROCEPHIN)  IV Stopped (11/20/19 1708)  . remdesivir 100 mg in NS 100 mL 100 mg (11/21/19 0810)     LOS: 2 days   Time spent: 35 minutes.  Patrecia Pour, MD [facility] [email or web address] 11/21/2019,  2:21 PM

## 2019-11-22 LAB — CBC WITH DIFFERENTIAL/PLATELET
Abs Immature Granulocytes: 0.57 10*3/uL — ABNORMAL HIGH (ref 0.00–0.07)
Basophils Absolute: 0.1 10*3/uL (ref 0.0–0.1)
Basophils Relative: 1 %
Eosinophils Absolute: 0 10*3/uL (ref 0.0–0.5)
Eosinophils Relative: 0 %
HCT: 44.5 % (ref 39.0–52.0)
Hemoglobin: 14.7 g/dL (ref 13.0–17.0)
Immature Granulocytes: 6 %
Lymphocytes Relative: 12 %
Lymphs Abs: 1 10*3/uL (ref 0.7–4.0)
MCH: 28.8 pg (ref 26.0–34.0)
MCHC: 33 g/dL (ref 30.0–36.0)
MCV: 87.3 fL (ref 80.0–100.0)
Monocytes Absolute: 0.8 10*3/uL (ref 0.1–1.0)
Monocytes Relative: 9 %
Neutro Abs: 6.5 10*3/uL (ref 1.7–7.7)
Neutrophils Relative %: 72 %
Platelets: 357 10*3/uL (ref 150–400)
RBC: 5.1 MIL/uL (ref 4.22–5.81)
RDW: 12.5 % (ref 11.5–15.5)
WBC: 9 10*3/uL (ref 4.0–10.5)
nRBC: 0 % (ref 0.0–0.2)

## 2019-11-22 LAB — COMPREHENSIVE METABOLIC PANEL
ALT: 62 U/L — ABNORMAL HIGH (ref 0–44)
AST: 79 U/L — ABNORMAL HIGH (ref 15–41)
Albumin: 3.5 g/dL (ref 3.5–5.0)
Alkaline Phosphatase: 49 U/L (ref 38–126)
Anion gap: 13 (ref 5–15)
BUN: 20 mg/dL (ref 6–20)
CO2: 25 mmol/L (ref 22–32)
Calcium: 8.8 mg/dL — ABNORMAL LOW (ref 8.9–10.3)
Chloride: 101 mmol/L (ref 98–111)
Creatinine, Ser: 1.15 mg/dL (ref 0.61–1.24)
GFR calc Af Amer: >60 mL/min (ref >60)
GFR calc non Af Amer: >60 mL/min (ref >60)
Glucose, Bld: 118 mg/dL — ABNORMAL HIGH (ref 70–99)
Potassium: 4.2 mmol/L (ref 3.5–5.1)
Sodium: 139 mmol/L (ref 135–145)
Total Bilirubin: 0.9 mg/dL (ref 0.3–1.2)
Total Protein: 7 g/dL (ref 6.5–8.1)

## 2019-11-22 LAB — D-DIMER, QUANTITATIVE: D-Dimer, Quant: 0.94 ug/mL-FEU — ABNORMAL HIGH (ref 0.00–0.50)

## 2019-11-22 LAB — C-REACTIVE PROTEIN: CRP: 9.1 mg/dL — ABNORMAL HIGH (ref <1.0)

## 2019-11-22 LAB — FERRITIN: Ferritin: 864 ng/mL — ABNORMAL HIGH (ref 24–336)

## 2019-11-22 MED ORDER — OXYMETAZOLINE HCL 0.05 % NA SOLN
1.0000 | Freq: Two times a day (BID) | NASAL | Status: AC
  Administered 2019-11-22 – 2019-11-24 (×6): 1 via NASAL
  Filled 2019-11-22: qty 15

## 2019-11-22 MED ORDER — LORATADINE 10 MG PO TABS
10.0000 mg | ORAL_TABLET | Freq: Every day | ORAL | Status: DC
  Administered 2019-11-22 – 2019-11-28 (×7): 10 mg via ORAL
  Filled 2019-11-22 (×7): qty 1

## 2019-11-22 NOTE — Progress Notes (Signed)
Mr. Lahner had been up and down to the bathroom to which his energy/oxygen consumption was elevated and his SPO2 was 70's on 15L HFNC. His hands were also shaking during this observation and he was not recovering from his efforts. I discussed this with Mr. Pelt and he was in agreement that he was exhausted.He is currently on 15l HFNC and was getting an additional 15L via NRB after ambulating from the bathroom. I encouraged him to use the urinal at bedside. He can stand and pivot but walking to the bathroom was causing him an inability to recover. His SPO2 was 71% on 15l HFNC during those periods.   Mr. Melillo and I set a schedule to allow for rest periods. He planned a nap from 11-12 so I am moving tasks to 12. He is performing exercises as outlined for C&DB/ IS, and Flutter Valve. I spoke to his wife,Tobitha, and she is in agreement with our plan as well. Since changing his going to the bathroom, he is holding his O2 with his SPO2@ 91-93% on 15lHFNC.   We will attempt to decrease that O2 demand and improve lung expansion.

## 2019-11-22 NOTE — Progress Notes (Addendum)
Each time the patient has been up to the bathroom on 10 to 15 L [place], he has come back from the bathroom dyspneic. He was on 7 L HFNC at the beginning of night shift, sitting in the chair. After his first time to the bathroom, he was bumped up to 14 L HFNC. After the second time a NRB was put on at 15 L. He is currently wearing 15 L NRB and 15 L HFNC to keep his sats above 90%. Other VS are stable. At 0415 the patient was wearing just 15L HFNC and sats were 89-90%, while lying in bed.

## 2019-11-22 NOTE — Progress Notes (Addendum)
PROGRESS NOTE  Adam [name]  [id number] DOB: 1966/09/24 DOA: 11/18/2019 PCP: Susy Frizzle, MD   Brief Narrative: 54 year old with a history of HLD and OSA who presented to [name] 1/5 with a week of diarrhea and subsequent cough with dyspnea found to be hypoxic , febrile with bilateral infiltrates on CXR and positive SARS-CoV-2 testing. He was admitted to [name] and received a dose of tocilizumab due to markedly elevated inflammatory markers (CRP 34.8). Due to right midlung zone infiltrate, ceftriaxone and azithromycin have been ordered, remdesivir and steroids are continued.   Assessment & Plan: Principal Problem:   Acute respiratory failure due to COVID-19 [facility]) Active Problems:   OSA (obstructive sleep apnea)   ARF (acute renal failure) (HCC)  Acute hypoxic respiratory failure due to covid-19 pneumonia, superimposed bacterial CAP: BNP wnl. Tn normal. Positive SARS-CoV-2 PCR 1/4. Note somewhat delayed presentation as patient has been short of breath, taking off CPAP to breathe while sleeping for days. - Received tocilizumab 1/5.  - Continue remdesivir 1/5 - 1/9 - Continue decadron 1/5 - 1/14. Augmented 1/6, will continue q12h dosing given good clinical response thus far. - PCT responding to abx. While not specific, with predominantly RML infiltrate, will treat as RML CAP with ceftriaxone, azithromycin x5 days. - Continue supplemental oxygen to maintain SpO2 >89% and/or normal respiratory effort.  - Prone, mobilize, IS, FV, monitor blood cultures. HIV NR.  - Add afrin for nasal congestion.   AKI: Improved.  - Monitor, stable.  Obesity: BMI 38. Noted.   HLD:  - Continue statin. IF LFTs rise further, would likely stop statin.  LFT elevation: Very mild, due to viral infection +/- remdesivir, tocilizumab.  - Continue daily monitoring while on remdesivir. Slightly up, not C/I at this time.  Covid-19 gastroenteritis:  - Symptomatic management  OSA: With need for continuing CPAP here.   - Order CPAP qHS for OSA indication (not acute respiratory failure). D/w PCCM.  GERD:  - Continue PPI  DVT prophylaxis: Lovenox 0.5mg /kg q24h Code Status: Full Family Communication: Wife by phone Disposition Plan: Likely to return home +/- HH once hypoxia significantly improved. Possibly next week.  Consultants:   None  Procedures:   None  Antimicrobials:  Remdesivir, ceftriaxone, azithromycin 1/5 - 1/9  Subjective: Had a bad night, awoke abruptly for unclear reasons and felt short of breath/anxious, unable to get back to sleep. Oxygen needs have increased with stable exertional dyspnea no chest pain. Feels like his nose is entirely blocked on one side.  Objective: Vitals:   11/22/19 0430 11/22/19 0800 11/22/19 1200 11/22/19 1500  BP: (!) 141/65 (!) 142/71 138/80 137/81  Pulse: 81 89 79 86  Resp: (!) 22 (!) 21 18 20   Temp: 98.7 F (37.1 C) 98.9 F (37.2 C) 98.8 F (37.1 C) 98 F (36.7 C)  TempSrc: Oral Oral Oral Oral  SpO2: (!) 89% 90% (!) 86% 90%  Weight:      Height:       Gen: 54 y.o. male tired-appearing but in no distress Pulm: Tachypneic without accessory muscle use. Crackles stable. CV: Regular rate and rhythm. No murmur, rub, or gallop. No JVD, no dependent edema. GI: Abdomen soft, non-tender, non-distended, with normoactive bowel sounds.  Ext: Warm, no deformities Skin: No rashes, lesions or ulcers on visualized skin. Neuro: Alert and oriented. No focal neurological deficits. Psych: Judgement and insight appear fair. Mood euthymic & affect congruent. Behavior is appropriate.    Data Reviewed: I have personally reviewed following labs and imaging studies  CBC: Recent Labs  Lab 11/18/19 2346 11/19/19 0631 11/20/19 0000 11/21/19 0530 11/22/19 0200  WBC 13.0* 12.1* 8.7 8.0 9.0  NEUTROABS 12.0*  --  7.7 6.5 6.5  HGB 13.9 13.2 12.6* 14.1 14.7  HCT 43.3 39.1 38.1* 43.0 44.5  MCV 87.7 88.3 86.6 86.9 87.3  PLT 167 145* 196 321 [id number]   Basic Metabolic  Panel: Recent Labs  Lab 11/18/19 2346 11/19/19 0631 11/20/19 0000 11/21/19 0530 11/22/19 0200  NA 137  --  139 139 139  K 3.7  --  4.1 4.3 4.2  CL 103  --  103 104 101  CO2 20*  --  24 21* 25  GLUCOSE 146*  --  96 128* 118*  BUN 13  --  16 19 20   CREATININE 1.40* 1.23 1.13 1.21 1.15  CALCIUM 8.7*  --  8.5* 8.7* 8.8*   Liver Function Tests: Recent Labs  Lab 11/18/19 2346 11/20/19 0000 11/21/19 0530 11/22/19 0200  AST 39 53* 68* 79*  ALT 26 39 47* 62*  ALKPHOS 51 67 59 49  BILITOT 0.5 0.6 0.4 0.9  PROT 7.2 6.3* 7.0 7.0  ALBUMIN 3.4* 2.9* 3.3* 3.5   Anemia Panel: Recent Labs    11/21/19 0530 11/22/19 0200  FERRITIN 929* 864*   Recent Results (from the past 240 hour(s))  Respiratory Panel by RT PCR (Flu A&B, Covid) - Nasopharyngeal Swab     Status: Abnormal   Collection Time: 11/18/19 11:42 PM   Specimen: Nasopharyngeal Swab  Result Value Ref Range Status   SARS Coronavirus 2 by RT PCR POSITIVE (A) NEGATIVE Final    Comment: CRITICAL RESULT CALLED TO, READ BACK BY AND VERIFIED WITH: FARRAR,S @ 0307 ON [id number] BY POTEAT,S (NOTE) SARS-CoV-2 target nucleic acids are DETECTED. SARS-CoV-2 RNA is generally detectable in upper respiratory specimens  during the acute phase of infection. Positive results are indicative of the presence of the identified virus, but do not rule out bacterial infection or co-infection with other pathogens not detected by the test. Clinical correlation with patient history and other diagnostic information is necessary to determine patient infection status. The expected result is Negative. Fact Sheet for Patients:  PinkCheek.be Fact Sheet for Healthcare Providers: GravelBags.it This test is not yet approved or cleared by the [place] FDA and  has been authorized for detection and/or diagnosis of SARS-CoV-2 by FDA under an Emergency Use Authorization (EUA).  This EUA will remain in  effect (meaning this test can  be used) for the duration of  the COVID-19 declaration under Section 564(b)(1) of the Act, 21 U.S.C. section 360bbb-3(b)(1), unless the authorization is terminated or revoked sooner.    Influenza A by PCR NEGATIVE NEGATIVE Final   Influenza B by PCR NEGATIVE NEGATIVE Final    Comment: (NOTE) The Xpert Xpress SARS-CoV-2/FLU/RSV assay is intended as an aid in  the diagnosis of influenza from Nasopharyngeal swab specimens and  should not be used as a sole basis for treatment. Nasal washings and  aspirates are unacceptable for Xpert Xpress SARS-CoV-2/FLU/RSV  testing. Fact Sheet for Patients: PinkCheek.be Fact Sheet for Healthcare Providers: GravelBags.it This test is not yet approved or cleared by the [place] FDA and  has been authorized for detection and/or diagnosis of SARS-CoV-2 by  FDA under an Emergency Use Authorization (EUA). This EUA will remain  in effect (meaning this test can be used) for the duration of the  Covid-19 declaration under Section 564(b)(1) of the Act, 21  U.S.C. section 360bbb-3(b)(1), unless  the authorization is  terminated or revoked. Performed at [facility], [place] [address], [place], [place] [address]   Blood Culture (routine x 2)     Status: None (Preliminary result)   Collection Time: 11/18/19 11:46 PM   Specimen: BLOOD  Result Value Ref Range Status   Specimen Description   Final    BLOOD LEFT ANTECUBITAL Performed at [facility] [address], [place], [place] [address]    Special Requests   Final    BOTTLES DRAWN AEROBIC AND ANAEROBIC Blood Culture adequate volume Performed at [facility] [address], [place], [place] [address]    Culture   Final    NO GROWTH 3 DAYS Performed at [facility], [place] [address], [place], [place] [address]    Report Status PENDING  Incomplete  Blood  Culture (routine x 2)     Status: None (Preliminary result)   Collection Time: 11/18/19 11:51 PM   Specimen: BLOOD  Result Value Ref Range Status   Specimen Description   Final    BLOOD RIGHT ANTECUBITAL Performed at [facility] [address], [place], [place] [address]    Special Requests   Final    BOTTLES DRAWN AEROBIC AND ANAEROBIC Blood Culture adequate volume Performed at [facility] [address], [place], [place] [address]    Culture   Final    NO GROWTH 3 DAYS Performed at [facility], [place] [address], [place], [place] [address]    Report Status PENDING  Incomplete      Radiology Studies: No results found.  Scheduled Meds: . dexamethasone (DECADRON) injection  6 mg Intravenous Q12H  . dextromethorphan-guaiFENesin  1 tablet Oral BID  . enoxaparin (LOVENOX) injection  0.5 mg/kg Subcutaneous Daily  . ipratropium  2 puff Inhalation Q4H  . loratadine  10 mg Oral Daily  . oxymetazoline  1 spray Each Nare BID  . pantoprazole  40 mg Oral Daily   Continuous Infusions: . azithromycin Stopped (11/21/19 1927)  . cefTRIAXone (ROCEPHIN)  IV 2 g (11/22/19 1527)  . remdesivir 100 mg in NS 100 mL 100 mg (11/22/19 1022)     LOS: 3 days   Time spent: 35 minutes.  Patrecia Pour, MD [facility] [email or web address] 11/22/2019, 4:10 PM

## 2019-11-23 LAB — COMPREHENSIVE METABOLIC PANEL
ALT: 90 U/L — ABNORMAL HIGH (ref 0–44)
AST: 83 U/L — ABNORMAL HIGH (ref 15–41)
Albumin: 3.5 g/dL (ref 3.5–5.0)
Alkaline Phosphatase: 47 U/L (ref 38–126)
Anion gap: 12 (ref 5–15)
BUN: 23 mg/dL — ABNORMAL HIGH (ref 6–20)
CO2: 25 mmol/L (ref 22–32)
Calcium: 9 mg/dL (ref 8.9–10.3)
Chloride: 102 mmol/L (ref 98–111)
Creatinine, Ser: 1.14 mg/dL (ref 0.61–1.24)
GFR calc Af Amer: >60 mL/min (ref >60)
GFR calc non Af Amer: >60 mL/min (ref >60)
Glucose, Bld: 122 mg/dL — ABNORMAL HIGH (ref 70–99)
Potassium: 4.3 mmol/L (ref 3.5–5.1)
Sodium: 139 mmol/L (ref 135–145)
Total Bilirubin: 0.8 mg/dL (ref 0.3–1.2)
Total Protein: 6.7 g/dL (ref 6.5–8.1)

## 2019-11-23 LAB — D-DIMER, QUANTITATIVE: D-Dimer, Quant: 1.2 ug/mL-FEU — ABNORMAL HIGH (ref 0.00–0.50)

## 2019-11-23 LAB — C-REACTIVE PROTEIN: CRP: 4.9 mg/dL — ABNORMAL HIGH (ref <1.0)

## 2019-11-23 MED ORDER — DEXAMETHASONE SODIUM PHOSPHATE 10 MG/ML IJ SOLN
6.0000 mg | INTRAMUSCULAR | Status: DC
  Administered 2019-11-24 – 2019-11-28 (×5): 6 mg via INTRAVENOUS
  Filled 2019-11-23 (×5): qty 1

## 2019-11-23 NOTE — Progress Notes (Signed)
Mr. Stfleur has had an improvement today as he started out being anxious with hearing alarms and automatically placing the 15L NRB mask to his face in addition to the 15L HFNC for SPO2 %'s in the 78-85% range. He has not had more than a two hour rest period and needs sleep along with his regularly prescribed CPAP.  We want to get Mr. 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 Provider Bonner Puna was made aware of the inability and plan to secure CPAP as soon it is possible. Mr. Kovacic and I planned specific rest periods for the day to which he has been able to achieve success and is now only using the 15L HFNC with SPO2 ranging from 88-94%. He appears more calm and relaxed and was observed playing on his ipad which he has not done in the last few days. He continues to use respiratory tools to assist with lung expansion exercises. He is continuing his IV antbx's and Remdesivir dose 4/5 was also given today. His labs show improvement. His diarrhea is resolving as he had a small formed bm today. I will continue to encourage him today and have provided communication to his wife Tobitha throughout the day.

## 2019-11-23 NOTE — Progress Notes (Addendum)
PROGRESS NOTE  Adam [name]  [id number] DOB: December 12, 1965 DOA: 11/18/2019 PCP: Susy Frizzle, MD   Brief Narrative: 54 year old with a history of HLD and OSA who presented to [facility] 1/5 with a week of diarrhea and subsequent cough with dyspnea found to be hypoxic , febrile with bilateral infiltrates on CXR and positive SARS-CoV-2 testing. He was admitted to [facility] and received a dose of tocilizumab due to markedly elevated inflammatory markers (CRP 34.8). Due to right midlung zone infiltrate, ceftriaxone and azithromycin have been ordered, remdesivir and steroids are continued.   Assessment & Plan: Principal Problem:   Acute respiratory failure due to COVID-19 [facility]) Active Problems:   OSA (obstructive sleep apnea)   ARF (acute renal failure) (HCC)  Acute hypoxic respiratory failure due to covid-19 pneumonia, superimposed bacterial CAP: BNP wnl. Tn normal. Positive SARS-CoV-2 PCR 1/4. Note somewhat delayed presentation as patient has been short of breath, taking off CPAP to breathe while sleeping for days. - Received tocilizumab 1/5.  - Continue remdesivir 1/5 - 1/9 - Continue decadron 1/5 - 1/14. Augmented 1/6, will continue q12h - PCT responding to abx. While not specific, with predominantly RML infiltrate, will treat as RML CAP with ceftriaxone, azithromycin x5 days. Repeat in AM. - Continue supplemental oxygen to maintain SpO2 >89% and/or normal respiratory effort.  - Prone, mobilize, IS, FV, monitor blood cultures. HIV NR.  - Added afrin for nasal congestion.   AKI: Improved.  - Monitor, stable.  Obesity: BMI 38. Noted.   HLD:  - Stopped statin with mild rise in LFTs.  LFT elevation: Very mild, due to viral infection +/- remdesivir, tocilizumab.  - Continue daily monitoring while on remdesivir. Slightly up, not C/I at this time.  Covid-19 gastroenteritis:  - Symptomatic management, resolved.  OSA: With need for continuing CPAP here.  - Ordered CPAP qHS for OSA indication (not  acute respiratory failure). D/w PCCM. 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 D/w RN.  GERD:  - Continue PPI  DVT prophylaxis: Lovenox 0.5mg /kg q24h Code Status: Full Family Communication: Wife by phone Disposition Plan: Likely to return home +/- HH once hypoxia significantly improved. Possibly next week.  Consultants:   None  Procedures:   None  Antimicrobials:  Remdesivir, ceftriaxone, azithromycin 1/5 - 1/9  Subjective: Continues to feel very short of breath with any exertion, now limiting OOB to chair and BSC. Got 3 hours sleep last night, didn't have CPAP available for unclear reasons, asked charge RN to ensure this is available going forward. Got up a significant sputum this morning, some nose bleed noted, not ongoing. A family friend died earlier this morning after being on the ventilator for a couple weeks.  Objective: Vitals:   11/23/19 0030 11/23/19 0450 11/23/19 0800 11/23/19 1200  BP: (!) 148/80 137/86 135/74 (!) 141/81  Pulse: 77 100 93 88  Resp: 20 (!) 22 (!) 22 16  Temp: 99.4 F (37.4 C) 98.5 F (36.9 C) 98.1 F (36.7 C) 98.6 F (37 C)  TempSrc: Oral Oral Oral Oral  SpO2: 90% (!) 88% 90% (!) 88%  Weight:      Height:       Gen: 54 y.o. male in no distress Pulm: Nonlabored tachypnea with 15LPM, Crackles worst at bases, stable. No wheezes. CV: Regular rate and rhythm. No murmur, rub, or gallop. No JVD, no significant dependent edema. GI: Abdomen soft, non-distended, + bowel sounds.  Ext: Warm, no deformities Skin: No rashes, lesions or ulcers on  visualized skin. Neuro: Alert and oriented. No focal neurological deficits. Psych: Judgement and insight appear fair. Mood euthymic & affect congruent. Behavior is appropriate.    Data Reviewed: I have personally reviewed following labs and imaging studies  CBC: Recent Labs  Lab 11/18/19 2346 11/19/19 0631 11/20/19 0000 11/21/19 0530 11/22/19 0200  WBC 13.0* 12.1* 8.7 8.0 9.0   NEUTROABS 12.0*  --  7.7 6.5 6.5  HGB 13.9 13.2 12.6* 14.1 14.7  HCT 43.3 39.1 38.1* 43.0 44.5  MCV 87.7 88.3 86.6 86.9 87.3  PLT 167 145* 196 321 [id number]   Basic Metabolic Panel: Recent Labs  Lab 11/18/19 2346 11/19/19 0631 11/20/19 0000 11/21/19 0530 11/22/19 0200 11/23/19 0522  NA 137  --  139 139 139 139  K 3.7  --  4.1 4.3 4.2 4.3  CL 103  --  103 104 101 102  CO2 20*  --  24 21* 25 25  GLUCOSE 146*  --  96 128* 118* 122*  BUN 13  --  16 19 20  23*  CREATININE 1.40* 1.23 1.13 1.21 1.15 1.14  CALCIUM 8.7*  --  8.5* 8.7* 8.8* 9.0   Liver Function Tests: Recent Labs  Lab 11/18/19 2346 11/20/19 0000 11/21/19 0530 11/22/19 0200 11/23/19 0522  AST 39 53* 68* 79* 83*  ALT 26 39 47* 62* 90*  ALKPHOS 51 67 59 49 47  BILITOT 0.5 0.6 0.4 0.9 0.8  PROT 7.2 6.3* 7.0 7.0 6.7  ALBUMIN 3.4* 2.9* 3.3* 3.5 3.5   Anemia Panel: Recent Labs    11/21/19 0530 11/22/19 0200  FERRITIN 929* 864*   Recent Results (from the past 240 hour(s))  Respiratory Panel by RT PCR (Flu A&B, Covid) - Nasopharyngeal Swab     Status: Abnormal   Collection Time: 11/18/19 11:42 PM   Specimen: Nasopharyngeal Swab  Result Value Ref Range Status   SARS Coronavirus 2 by RT PCR POSITIVE (A) NEGATIVE Final    Comment: CRITICAL RESULT CALLED TO, READ BACK BY AND VERIFIED WITH: FARRAR,S @ 0307 ON [id number] BY POTEAT,S (NOTE) SARS-CoV-2 target nucleic acids are DETECTED. SARS-CoV-2 RNA is generally detectable in upper respiratory specimens  during the acute phase of infection. Positive results are indicative of the presence of the identified virus, but do not rule out bacterial infection or co-infection with other pathogens not detected by the test. Clinical correlation with patient history and other diagnostic information is necessary to determine patient infection status. The expected result is Negative. Fact Sheet for Patients:  PinkCheek.be Fact Sheet for Healthcare  Providers: GravelBags.it This test is not yet approved or cleared by the [place] FDA and  has been authorized for detection and/or diagnosis of SARS-CoV-2 by FDA under an Emergency Use Authorization (EUA).  This EUA will remain in effect (meaning this test can  be used) for the duration of  the COVID-19 declaration under Section 564(b)(1) of the Act, 21 U.S.C. section 360bbb-3(b)(1), unless the authorization is terminated or revoked sooner.    Influenza A by PCR NEGATIVE NEGATIVE Final   Influenza B by PCR NEGATIVE NEGATIVE Final    Comment: (NOTE) The Xpert Xpress SARS-CoV-2/FLU/RSV assay is intended as an aid in  the diagnosis of influenza from Nasopharyngeal swab specimens and  should not be used as a sole basis for treatment. Nasal washings and  aspirates are unacceptable for Xpert Xpress SARS-CoV-2/FLU/RSV  testing. Fact Sheet for Patients: PinkCheek.be Fact Sheet for Healthcare Providers: GravelBags.it This test is not yet approved or cleared by  the [employer] and  has been authorized for detection and/or diagnosis of SARS-CoV-2 by  FDA under an Emergency Use Authorization (EUA). This EUA will remain  in effect (meaning this test can be used) for the duration of the  Covid-19 declaration under Section 564(b)(1) of the Act, 21  U.S.C. section 360bbb-3(b)(1), unless the authorization is  terminated or revoked. Performed at [facility], [place] [address] [place], [place] [address]   Blood Culture (routine x 2)     Status: None (Preliminary result)   Collection Time: 11/18/19 11:46 PM   Specimen: BLOOD  Result Value Ref Range Status   Specimen Description   Final    BLOOD LEFT ANTECUBITAL Performed at [facility] [address], [place], [place] [address]    Special Requests   Final    BOTTLES DRAWN AEROBIC AND ANAEROBIC Blood Culture  adequate volume Performed at [facility] [address] [place], [place] [address]    Culture   Final    NO GROWTH 4 DAYS Performed at [facility], [place] [address] [place], [place] [address]    Report Status PENDING  Incomplete  Blood Culture (routine x 2)     Status: None (Preliminary result)   Collection Time: 11/18/19 11:51 PM   Specimen: BLOOD  Result Value Ref Range Status   Specimen Description   Final    BLOOD RIGHT ANTECUBITAL Performed at [facility] [address] [place], [place] [address]    Special Requests   Final    BOTTLES DRAWN AEROBIC AND ANAEROBIC Blood Culture adequate volume Performed at [facility] [address], [place], [place] [address]    Culture   Final    NO GROWTH 4 DAYS Performed at [facility], [place] [address], [place], [place] [address]    Report Status PENDING  Incomplete      Radiology Studies: No results found.  Scheduled Meds: . dexamethasone (DECADRON) injection  6 mg Intravenous Q12H  . dextromethorphan-guaiFENesin  1 tablet Oral BID  . enoxaparin (LOVENOX) injection  0.5 mg/kg Subcutaneous Daily  . ipratropium  2 puff Inhalation Q4H  . loratadine  10 mg Oral Daily  . oxymetazoline  1 spray Each Nare BID  . pantoprazole  40 mg Oral Daily   Continuous Infusions: . azithromycin Stopped (11/22/19 1739)  . cefTRIAXone (ROCEPHIN)  IV Stopped (11/22/19 1557)     LOS: 4 days   Time spent: 35 minutes.  Patrecia Pour, MD [facility] [email or web address] 11/23/2019, 1:54 PM

## 2019-11-24 LAB — COMPREHENSIVE METABOLIC PANEL
ALT: 87 U/L — ABNORMAL HIGH (ref 0–44)
AST: 61 U/L — ABNORMAL HIGH (ref 15–41)
Albumin: 3.5 g/dL (ref 3.5–5.0)
Alkaline Phosphatase: 51 U/L (ref 38–126)
Anion gap: 12 (ref 5–15)
BUN: 22 mg/dL — ABNORMAL HIGH (ref 6–20)
CO2: 25 mmol/L (ref 22–32)
Calcium: 8.8 mg/dL — ABNORMAL LOW (ref 8.9–10.3)
Chloride: 100 mmol/L (ref 98–111)
Creatinine, Ser: 1.14 mg/dL (ref 0.61–1.24)
GFR calc Af Amer: >60 mL/min (ref >60)
GFR calc non Af Amer: >60 mL/min (ref >60)
Glucose, Bld: 100 mg/dL — ABNORMAL HIGH (ref 70–99)
Potassium: 3.8 mmol/L (ref 3.5–5.1)
Sodium: 137 mmol/L (ref 135–145)
Total Bilirubin: 0.6 mg/dL (ref 0.3–1.2)
Total Protein: 6.7 g/dL (ref 6.5–8.1)

## 2019-11-24 LAB — CULTURE, BLOOD (ROUTINE X 2)
Culture: NO GROWTH
Culture: NO GROWTH
Special Requests: ADEQUATE
Special Requests: ADEQUATE

## 2019-11-24 LAB — D-DIMER, QUANTITATIVE: D-Dimer, Quant: 2.22 ug/mL-FEU — ABNORMAL HIGH (ref 0.00–0.50)

## 2019-11-24 LAB — PROCALCITONIN: Procalcitonin: <0.1 ng/mL

## 2019-11-24 LAB — C-REACTIVE PROTEIN: CRP: 2.8 mg/dL — ABNORMAL HIGH (ref <1.0)

## 2019-11-24 MED ORDER — TRAZODONE HCL 50 MG PO TABS
100.0000 mg | ORAL_TABLET | Freq: Every day | ORAL | Status: DC
  Administered 2019-11-24 – 2019-11-27 (×4): 100 mg via ORAL
  Filled 2019-11-24 (×4): qty 2

## 2019-11-24 MED ORDER — DIPHENHYDRAMINE HCL 25 MG PO CAPS
25.0000 mg | ORAL_CAPSULE | ORAL | Status: DC | PRN
  Administered 2019-11-24 – 2019-11-25 (×3): 25 mg via ORAL
  Filled 2019-11-24 (×3): qty 1

## 2019-11-24 MED ORDER — CLONAZEPAM 0.5 MG PO TABS
0.5000 mg | ORAL_TABLET | Freq: Two times a day (BID) | ORAL | Status: DC | PRN
  Administered 2019-11-25: 0.5 mg via ORAL
  Filled 2019-11-24: qty 1

## 2019-11-24 MED ORDER — DIPHENHYDRAMINE HCL 50 MG/ML IJ SOLN: 12.5000 mg | Freq: Four times a day (QID) | INTRAMUSCULAR | Status: DC | PRN

## 2019-11-24 NOTE — Progress Notes (Signed)
PROGRESS NOTE  Adam [name]  [id number] DOB: Jul 04, 1966 DOA: 11/18/2019 PCP: Susy Frizzle, MD   Brief Narrative: 54 year old with a history of HLD and OSA who presented to [facility] 1/5 with a week of diarrhea and subsequent cough with dyspnea found to be hypoxic , febrile with bilateral infiltrates on CXR and positive SARS-CoV-2 testing. He was admitted to [facility] and received a dose of tocilizumab due to markedly elevated inflammatory markers (CRP 34.8). Due to right midlung zone infiltrate, ceftriaxone and azithromycin have been ordered, remdesivir and steroids are continued.   Assessment & Plan: Principal Problem:   Acute respiratory failure due to COVID-19 [facility]) Active Problems:   OSA (obstructive sleep apnea)   ARF (acute renal failure) (HCC)  Acute hypoxic respiratory failure due to covid-19 pneumonia, superimposed RML bacterial CAP: BNP wnl. Tn normal. Positive SARS-CoV-2 PCR 1/4. Note somewhat delayed presentation as patient has been short of breath, taking off CPAP to breathe while sleeping for days. - Received tocilizumab 1/5.  - Continue remdesivir 1/5 - 1/9 - Continue decadron 1/5 - 1/14. Augmented 1/6, decrease intensity as inflammatory markers sustaining improvement and steroid side effects are limiting rest.  - PCT normalized. - Recheck CXR in AM - Continue supplemental oxygen to maintain SpO2 >89% and/or normal respiratory effort.  - Prone, mobilize, IS, FV, monitor blood cultures. HIV NR.  - Added afrin for nasal congestion.   AKI: Improved.  - Monitor, stable.  Obesity: BMI 38. Noted.   HLD:  - Stopped statin with mild rise in LFTs.  LFT elevation: Very mild, due to viral infection +/- remdesivir, tocilizumab. Improved today. - Continue monitoring  Covid-19 gastroenteritis:  - Symptomatic management, resolved.  OSA: With need for continuing CPAP here.  - Ordered CPAP qHS for OSA indication (not acute respiratory failure). D/w PCCM. 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 D/w RN.  GERD:  - Continue PPI  DVT prophylaxis: Lovenox 0.5mg /kg q24h Code Status: Full Family Communication: Wife by phone Disposition Plan: Likely to return home +/- HH once hypoxia significantly improved. Possibly next week.  Consultants:   None  Procedures:   None  Antimicrobials:  Remdesivir, ceftriaxone, azithromycin 1/5 - 1/9  Subjective: Did not get rest last night, feels fidgety. CPAP didn't fit right, benadryl didn't seem to help too much. Would like to try additional medication tonight. No chest pain, leg swelling, or other pain.   Objective: Vitals:   11/24/19 0000 11/24/19 0336 11/24/19 0800 11/24/19 1142  BP:   115/80 (!) 120/57  Pulse: 79  (!) 101 76  Resp: 15  14 (!) 21  Temp:  98.3 F (36.8 C) 97.9 F (36.6 C) 97.6 F (36.4 C)  TempSrc:  Oral Oral Oral  SpO2: 98% 90% (!) 88% 93%  Weight:      Height:       Gen: Nervous appearing male in no distress Pulm: Nonlabored breathing supplemental oxygen, crackles at bases, maybe a little better. CV: Regular rate and rhythm. No murmur, rub, or gallop. No JVD, no dependent edema. GI: Abdomen soft, non-tender, non-distended, with normoactive bowel sounds.  Ext: Warm, no deformities Skin: No rashes, lesions or ulcers on visualized skin. Neuro: Alert and oriented. Jittery with intention tremor. No focal neurological deficits. Psych: Judgement and insight appear fair. Mood euthymic & affect congruent. Behavior is appropriate.    Data Reviewed: I have personally reviewed following labs and imaging studies  CBC: Recent Labs  Lab 11/18/19 2346 11/19/19 0631  11/20/19 0000 11/21/19 0530 11/22/19 0200  WBC 13.0* 12.1* 8.7 8.0 9.0  NEUTROABS 12.0*  --  7.7 6.5 6.5  HGB 13.9 13.2 12.6* 14.1 14.7  HCT 43.3 39.1 38.1* 43.0 44.5  MCV 87.7 88.3 86.6 86.9 87.3  PLT 167 145* 196 321 [id number]   Basic Metabolic Panel: Recent Labs  Lab 11/20/19 0000 11/21/19 0530 11/22/19 0200  11/23/19 0522 11/24/19 0335  NA 139 139 139 139 137  K 4.1 4.3 4.2 4.3 3.8  CL 103 104 101 102 100  CO2 24 21* 25 25 25   GLUCOSE 96 128* 118* 122* 100*  BUN 16 19 20  23* 22*  CREATININE 1.13 1.21 1.15 1.14 1.14  CALCIUM 8.5* 8.7* 8.8* 9.0 8.8*   Liver Function Tests: Recent Labs  Lab 11/20/19 0000 11/21/19 0530 11/22/19 0200 11/23/19 0522 11/24/19 0335  AST 53* 68* 79* 83* 61*  ALT 39 47* 62* 90* 87*  ALKPHOS 67 59 49 47 51  BILITOT 0.6 0.4 0.9 0.8 0.6  PROT 6.3* 7.0 7.0 6.7 6.7  ALBUMIN 2.9* 3.3* 3.5 3.5 3.5   Anemia Panel: Recent Labs    11/22/19 0200  FERRITIN 864*   Recent Results (from the past 240 hour(s))  Respiratory Panel by RT PCR (Flu A&B, Covid) - Nasopharyngeal Swab     Status: Abnormal   Collection Time: 11/18/19 11:42 PM   Specimen: Nasopharyngeal Swab  Result Value Ref Range Status   SARS Coronavirus 2 by RT PCR POSITIVE (A) NEGATIVE Final    Comment: CRITICAL RESULT CALLED TO, READ BACK BY AND VERIFIED WITH: FARRAR,S @ 0307 ON [id number] BY POTEAT,S (NOTE) SARS-CoV-2 target nucleic acids are DETECTED. SARS-CoV-2 RNA is generally detectable in upper respiratory specimens  during the acute phase of infection. Positive results are indicative of the presence of the identified virus, but do not rule out bacterial infection or co-infection with other pathogens not detected by the test. Clinical correlation with patient history and other diagnostic information is necessary to determine patient infection status. The expected result is Negative. Fact Sheet for Patients:  PinkCheek.be Fact Sheet for Healthcare Providers: GravelBags.it This test is not yet approved or cleared by the [place] FDA and  has been authorized for detection and/or diagnosis of SARS-CoV-2 by FDA under an Emergency Use Authorization (EUA).  This EUA will remain in effect (meaning this test can  be used) for the duration of   the COVID-19 declaration under Section 564(b)(1) of the Act, 21 U.S.C. section 360bbb-3(b)(1), unless the authorization is terminated or revoked sooner.    Influenza A by PCR NEGATIVE NEGATIVE Final   Influenza B by PCR NEGATIVE NEGATIVE Final    Comment: (NOTE) The Xpert Xpress SARS-CoV-2/FLU/RSV assay is intended as an aid in  the diagnosis of influenza from Nasopharyngeal swab specimens and  should not be used as a sole basis for treatment. Nasal washings and  aspirates are unacceptable for Xpert Xpress SARS-CoV-2/FLU/RSV  testing. Fact Sheet for Patients: PinkCheek.be Fact Sheet for Healthcare Providers: GravelBags.it This test is not yet approved or cleared by the [place] FDA and  has been authorized for detection and/or diagnosis of SARS-CoV-2 by  FDA under an Emergency Use Authorization (EUA). This EUA will remain  in effect (meaning this test can be used) for the duration of the  Covid-19 declaration under Section 564(b)(1) of the Act, 21  U.S.C. section 360bbb-3(b)(1), unless the authorization is  terminated or revoked. Performed at [facility], [address] [place],  [place] [address]   Blood Culture (routine x 2)     Status: None   Collection Time: 11/18/19 11:46 PM   Specimen: BLOOD  Result Value Ref Range Status   Specimen Description   Final    BLOOD LEFT ANTECUBITAL Performed at [facility] [address], [place], [place] [address]    Special Requests   Final    BOTTLES DRAWN AEROBIC AND ANAEROBIC Blood Culture adequate volume Performed at [facility] [address] [place], [place] [address]    Culture   Final    NO GROWTH 5 DAYS Performed at [facility], [place] [address] [place], [place] [address]    Report Status 11/24/2019 FINAL  Final  Blood Culture (routine x 2)     Status: None   Collection Time: 11/18/19 11:51 PM    Specimen: BLOOD  Result Value Ref Range Status   Specimen Description   Final    BLOOD RIGHT ANTECUBITAL Performed at [facility] [address] [place], [place] [address]    Special Requests   Final    BOTTLES DRAWN AEROBIC AND ANAEROBIC Blood Culture adequate volume Performed at [facility] [address], [place], [place] [address]    Culture   Final    NO GROWTH 5 DAYS Performed at [facility], [place] [address], [place],  [address]    Report Status 11/24/2019 FINAL  Final      Radiology Studies: No results found.  Scheduled Meds: . dexamethasone (DECADRON) injection  6 mg Intravenous Q24H  . dextromethorphan-guaiFENesin  1 tablet Oral BID  . enoxaparin (LOVENOX) injection  0.5 mg/kg Subcutaneous Daily  . ipratropium  2 puff Inhalation Q4H  . loratadine  10 mg Oral Daily  . oxymetazoline  1 spray Each Nare BID  . pantoprazole  40 mg Oral Daily   Continuous Infusions: . azithromycin Stopped (11/23/19 1801)  . cefTRIAXone (ROCEPHIN)  IV Stopped (11/23/19 1551)     LOS: 5 days   Time spent: 35 minutes.  Patrecia Pour, MD [facility] [email or web address] 11/24/2019, 3:00 PM

## 2019-11-24 NOTE — Progress Notes (Signed)
The patient received his Cpap tonight and wanted it on about midnight. After adjusting the Cpap, the patient was unable to wear it. He said that he slept with just a nose piece and we were not able to get the mask and pressure to his liking. The patient exhibited high anxiety and was fidgety. The patient is anxious and fidgety at nights. He was unable to maintain sats above 88%. Dr. Vanita Ingles was paged and came in to assess the patient. He asked the patient if someone was able to deliver his cpap to the hospital and wrote a new order for Benadryl. After taking the Benadryl, the patient wanted to sleep in the recliner with the HFNC and NRB, both at 15L. The patient stated that he would retry the cpap tomorrow during the day.

## 2019-11-24 NOTE — Progress Notes (Signed)
Pt wife updated and all questions answered.  

## 2019-11-24 NOTE — Progress Notes (Signed)
Patient refused CPAP at this time. Currently on 8L HFNC.

## 2019-11-25 ENCOUNTER — Inpatient Hospital Stay (HOSPITAL_COMMUNITY): Payer: BC Managed Care – PPO

## 2019-11-25 LAB — CBC WITH DIFFERENTIAL/PLATELET
Abs Immature Granulocytes: 0.67 10*3/uL — ABNORMAL HIGH (ref 0.00–0.07)
Basophils Absolute: 0.1 10*3/uL (ref 0.0–0.1)
Basophils Relative: 1 %
Eosinophils Absolute: 0.1 10*3/uL (ref 0.0–0.5)
Eosinophils Relative: 1 %
HCT: 48 % (ref 39.0–52.0)
Hemoglobin: 15.8 g/dL (ref 13.0–17.0)
Immature Granulocytes: 7 %
Lymphocytes Relative: 15 %
Lymphs Abs: 1.5 10*3/uL (ref 0.7–4.0)
MCH: 28.6 pg (ref 26.0–34.0)
MCHC: 32.9 g/dL (ref 30.0–36.0)
MCV: 86.8 fL (ref 80.0–100.0)
Monocytes Absolute: 0.7 10*3/uL (ref 0.1–1.0)
Monocytes Relative: 7 %
Neutro Abs: 6.7 10*3/uL (ref 1.7–7.7)
Neutrophils Relative %: 69 %
Platelets: 413 10*3/uL — ABNORMAL HIGH (ref 150–400)
RBC: 5.53 MIL/uL (ref 4.22–5.81)
RDW: 12.7 % (ref 11.5–15.5)
WBC: 9.8 10*3/uL (ref 4.0–10.5)
nRBC: 0 % (ref 0.0–0.2)

## 2019-11-25 LAB — COMPREHENSIVE METABOLIC PANEL
ALT: 66 U/L — ABNORMAL HIGH (ref 0–44)
AST: 40 U/L (ref 15–41)
Albumin: 3.6 g/dL (ref 3.5–5.0)
Alkaline Phosphatase: 53 U/L (ref 38–126)
Anion gap: 16 — ABNORMAL HIGH (ref 5–15)
BUN: 19 mg/dL (ref 6–20)
CO2: 25 mmol/L (ref 22–32)
Calcium: 8.7 mg/dL — ABNORMAL LOW (ref 8.9–10.3)
Chloride: 99 mmol/L (ref 98–111)
Creatinine, Ser: 1.16 mg/dL (ref 0.61–1.24)
GFR calc Af Amer: >60 mL/min (ref >60)
GFR calc non Af Amer: >60 mL/min (ref >60)
Glucose, Bld: 93 mg/dL (ref 70–99)
Potassium: 3.7 mmol/L (ref 3.5–5.1)
Sodium: 140 mmol/L (ref 135–145)
Total Bilirubin: 1 mg/dL (ref 0.3–1.2)
Total Protein: 6.8 g/dL (ref 6.5–8.1)

## 2019-11-25 LAB — C-REACTIVE PROTEIN: CRP: 1.3 mg/dL — ABNORMAL HIGH (ref <1.0)

## 2019-11-25 LAB — D-DIMER, QUANTITATIVE: D-Dimer, Quant: 2.91 ug/mL-FEU — ABNORMAL HIGH (ref 0.00–0.50)

## 2019-11-25 NOTE — Progress Notes (Signed)
Patient refused CPAP at this time.

## 2019-11-25 NOTE — Progress Notes (Signed)
PROGRESS NOTE  Adam [name]  [id number] DOB: 07-Jun-1966 DOA: 11/18/2019 PCP: Susy Frizzle, MD   Brief Narrative: 54 year old with a history of HLD and OSA who presented to [facility] 1/5 with a week of diarrhea and subsequent cough with dyspnea found to be hypoxic , febrile with bilateral infiltrates on CXR and positive SARS-CoV-2 testing. He was admitted to [facility] and received a dose of tocilizumab due to markedly elevated inflammatory markers (CRP 34.8). Due to right midlung zone infiltrate, ceftriaxone and azithromycin were added, remdesivir and steroids continued. Inflammatory markers have improved significantly though hypoxemia has been more stubborn. Having completed full courses of antibiotics and antiviral therapy, oxygen requirement appears to be improving.  Assessment & Plan: Principal Problem:   Acute respiratory failure due to COVID-19 [facility]) Active Problems:   OSA (obstructive sleep apnea)   ARF (acute renal failure) (HCC)  Acute hypoxic respiratory failure due to covid-19 pneumonia, superimposed RML bacterial CAP: BNP wnl. Tn normal. Positive SARS-CoV-2 PCR 1/4. Note somewhat delayed presentation as patient has been short of breath, taking off CPAP to breathe while sleeping for days. - Received tocilizumab 1/5.  - Completed remdesivir 1/5 - 1/9 - Continue decadron 1/5 - 1/14.  - PCT normalized, CRP significantly decreased. - Recheck CXR evaluated personally showing better delineation of bilateral infiltrates appearing to be an interval worsening, though based on exam this represents delayed radiographic clearance. LLL appears better aerated on my review as well. Showed images and discussed with patient at the bedside. - Continue supplemental oxygen to maintain SpO2 >89% and/or normal respiratory effort.  - Prone, mobilize, IS, FV, monitor blood cultures. HIV NR.  - Added afrin for nasal congestion.   Insomnia:  - Trazodone appears to have improved symptoms, will  continue  Anxiety:  - Improving with decreased steroid dose and prn clonazepam without sedation.  AKI: Improved.  - Monitor, stable.  Obesity: BMI 38. Noted.   HLD:  - Stopped statin with mild rise in LFTs.  LFT elevation: Very mild, due to viral infection +/- remdesivir, tocilizumab. Sustaining improvement - Continue monitoring intermittently.  Covid-19 gastroenteritis:  - Symptomatic management, resolved.  OSA: With need for continuing CPAP here.  - Ordered CPAP qHS for OSA indication (not acute respiratory failure). D/w PCCM. 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 D/w RN.  GERD:  - Continue PPI  DVT prophylaxis: Lovenox 0.5mg /kg q24h Code Status: Full Family Communication: Wife by phone daily Disposition Plan: Likely to return home +/- HH once hypoxia significantly improved. Possibly later this week  Consultants:   None  Procedures:   None  Antimicrobials:  Remdesivir, ceftriaxone, azithromycin 1/5 - 1/9  Subjective: Finally had a somewhat restful night. Feels his breathing is improved at rest and with exertion. Still tired, feels behind on sleep. Nasal spray helping with nasal clearance.  Objective: Vitals:   11/25/19 0000 11/25/19 0410 11/25/19 0852 11/25/19 1220  BP: 127/68 (!) 118/53 127/79 115/76  Pulse: 98 78 93 81  Resp: 19 16 19  (!) 21  Temp: 98.7 F (37.1 C) 97.6 F (36.4 C) 98.5 F (36.9 C) 98.8 F (37.1 C)  TempSrc: Oral Oral Axillary Oral  SpO2: 91% 92% 91%   Weight:      Height:       Gen: 54 y.o. male in no distress Pulm: Nonlabored breathing supplemental oxygen at rest, crackles significantly improved. CV: Regular rate and rhythm. No murmur, rub, or gallop. No JVD, no pitting dependent edema. GI: Abdomen  soft, non-tender, non-distended, with normoactive bowel sounds.  Ext: Warm, no deformities Skin: No rashes, lesions or ulcers on visualized skin. Neuro: Alert and oriented. No focal neurological  deficits. Psych: Judgement and insight appear fair. Mood euthymic & affect congruent. Behavior is appropriate.     Data Reviewed: I have personally reviewed following labs and imaging studies  CBC: Recent Labs  Lab 11/18/19 2346 11/19/19 0631 11/20/19 0000 11/21/19 0530 11/22/19 0200 11/25/19 0355  WBC 13.0* 12.1* 8.7 8.0 9.0 9.8  NEUTROABS 12.0*  --  7.7 6.5 6.5 6.7  HGB 13.9 13.2 12.6* 14.1 14.7 15.8  HCT 43.3 39.1 38.1* 43.0 44.5 48.0  MCV 87.7 88.3 86.6 86.9 87.3 86.8  PLT 167 145* 196 321 357 [id number]*   Basic Metabolic Panel: Recent Labs  Lab 11/21/19 0530 11/22/19 0200 11/23/19 0522 11/24/19 0335 11/25/19 0355  NA 139 139 139 137 140  K 4.3 4.2 4.3 3.8 3.7  CL 104 101 102 100 99  CO2 21* 25 25 25 25   GLUCOSE 128* 118* 122* 100* 93  BUN 19 20 23* 22* 19  CREATININE 1.21 1.15 1.14 1.14 1.16  CALCIUM 8.7* 8.8* 9.0 8.8* 8.7*   Liver Function Tests: Recent Labs  Lab 11/21/19 0530 11/22/19 0200 11/23/19 0522 11/24/19 0335 11/25/19 0355  AST 68* 79* 83* 61* 40  ALT 47* 62* 90* 87* 66*  ALKPHOS 59 49 47 51 53  BILITOT 0.4 0.9 0.8 0.6 1.0  PROT 7.0 7.0 6.7 6.7 6.8  ALBUMIN 3.3* 3.5 3.5 3.5 3.6   Anemia Panel: No results for input(s): VITAMINB12, FOLATE, FERRITIN, TIBC, IRON, RETICCTPCT in the last 72 hours. Recent Results (from the past 240 hour(s))  Respiratory Panel by RT PCR (Flu A&B, Covid) - Nasopharyngeal Swab     Status: Abnormal   Collection Time: 11/18/19 11:42 PM   Specimen: Nasopharyngeal Swab  Result Value Ref Range Status   SARS Coronavirus 2 by RT PCR POSITIVE (A) NEGATIVE Final    Comment: CRITICAL RESULT CALLED TO, READ BACK BY AND VERIFIED WITH: FARRAR,S @ 0307 ON [id number] BY POTEAT,S (NOTE) SARS-CoV-2 target nucleic acids are DETECTED. SARS-CoV-2 RNA is generally detectable in upper respiratory specimens  during the acute phase of infection. Positive results are indicative of the presence of the identified virus, but do not rule  out bacterial infection or co-infection with other pathogens not detected by the test. Clinical correlation with patient history and other diagnostic information is necessary to determine patient infection status. The expected result is Negative. Fact Sheet for Patients:  PinkCheek.be Fact Sheet for Healthcare Providers: GravelBags.it This test is not yet approved or cleared by the [place] FDA and  has been authorized for detection and/or diagnosis of SARS-CoV-2 by FDA under an Emergency Use Authorization (EUA).  This EUA will remain in effect (meaning this test can  be used) for the duration of  the COVID-19 declaration under Section 564(b)(1) of the Act, 21 U.S.C. section 360bbb-3(b)(1), unless the authorization is terminated or revoked sooner.    Influenza A by PCR NEGATIVE NEGATIVE Final   Influenza B by PCR NEGATIVE NEGATIVE Final    Comment: (NOTE) The Xpert Xpress SARS-CoV-2/FLU/RSV assay is intended as an aid in  the diagnosis of influenza from Nasopharyngeal swab specimens and  should not be used as a sole basis for treatment. Nasal washings and  aspirates are unacceptable for Xpert Xpress SARS-CoV-2/FLU/RSV  testing. Fact Sheet for Patients: PinkCheek.be Fact Sheet for Healthcare Providers: GravelBags.it This test is not yet  approved or cleared by the [place] and  has been authorized for detection and/or diagnosis of SARS-CoV-2 by  FDA under an Emergency Use Authorization (EUA). This EUA will remain  in effect (meaning this test can be used) for the duration of the  Covid-19 declaration under Section 564(b)(1) of the Act, 21  U.S.C. section 360bbb-3(b)(1), unless the authorization is  terminated or revoked. Performed at [facility], [place] [address] [place], [place] [address]   Blood Culture (routine x 2)     Status: None    Collection Time: 11/18/19 11:46 PM   Specimen: BLOOD  Result Value Ref Range Status   Specimen Description   Final    BLOOD LEFT ANTECUBITAL Performed at [place] [address] [place], [place] [address]    Special Requests   Final    BOTTLES DRAWN AEROBIC AND ANAEROBIC Blood Culture adequate volume Performed at [place] [address], [place], [place] [address]    Culture   Final    NO GROWTH 5 DAYS Performed at [facility], [place] [address], [place], [place] [address]    Report Status 11/24/2019 FINAL  Final  Blood Culture (routine x 2)     Status: None   Collection Time: 11/18/19 11:51 PM   Specimen: BLOOD  Result Value Ref Range Status   Specimen Description   Final    BLOOD RIGHT ANTECUBITAL Performed at [place] [address] [place], [place] [address]    Special Requests   Final    BOTTLES DRAWN AEROBIC AND ANAEROBIC Blood Culture adequate volume Performed at [place] [address] [place], [place] [address]    Culture   Final    NO GROWTH 5 DAYS Performed at [facility], [place] [address], [place], [place] [address]    Report Status 11/24/2019 FINAL  Final      Radiology Studies: cxr 1v 5am  Result Date: 11/25/2019 CLINICAL DATA:  Acute respiratory failure due to COVID-19. EXAM: CHEST  1 VIEW COMPARISON:  November 18, 2019. FINDINGS: Stable cardiomediastinal silhouette. Significantly increased bilateral peripheral lung opacities are noted consistent with multifocal pneumonia, potentially of viral etiology. No pneumothorax or pleural effusion is noted. Bony thorax is unremarkable. IMPRESSION: Significantly increased bilateral peripheral lung opacities are noted consistent with multifocal pneumonia, potentially of viral etiology. Followup radiographs are recommended until resolution. Electronically Signed   By: Marijo Conception M.D.   On: 11/25/2019 07:58     Scheduled Meds: . dexamethasone (DECADRON) injection  6 mg Intravenous Q24H  . dextromethorphan-guaiFENesin  1 tablet Oral BID  . enoxaparin (LOVENOX) injection  0.5 mg/kg Subcutaneous Daily  . ipratropium  2 puff Inhalation Q4H  . loratadine  10 mg Oral Daily  . pantoprazole  40 mg Oral Daily  . traZODone  100 mg Oral QHS   Continuous Infusions:    LOS: 6 days   Time spent: 25 minutes.  Patrecia Pour, MD [facility] [email or web address] 11/25/2019, 3:26 PM

## 2019-11-25 NOTE — Progress Notes (Signed)
Wife, Adam [name], updated regarding patient condition and plan of care for this shift. All questions answered.

## 2019-11-25 NOTE — Progress Notes (Signed)
The patient had a better night. Refused the Cpap machine. He went between 7 and 9 L on the HFNC, the whole night.

## 2019-11-26 NOTE — Progress Notes (Signed)
PROGRESS NOTE  Adam [name]  [id number] DOB: [birth year] DOA: 11/18/2019 PCP: Susy Frizzle, MD   Brief Narrative: Adam [name] 54 year old with a history of HLD and OSA who presented to [facility] 1/5 with a week of diarrhea and subsequent cough with dyspnea found to be hypoxic , febrile with bilateral infiltrates on CXR and positive SARS-CoV-2 testing. He was admitted to [facility] and received a dose of tocilizumab due to markedly elevated inflammatory markers (CRP 34.8). Due to right midlung zone infiltrate, ceftriaxone and azithromycin were added, remdesivir and steroids continued. Inflammatory markers have improved significantly though hypoxemia has been more stubborn. Having completed full courses of antibiotics and antiviral therapy, oxygen requirement appears to be improving.  Assessment & Plan: Principal Problem:   Acute respiratory failure due to COVID-19 [facility]) Active Problems:   OSA (obstructive sleep apnea)   ARF (acute renal failure) (HCC)  Acute hypoxic respiratory failure due to covid-19 pneumonia, superimposed RML bacterial CAP: BNP wnl. Tn normal. Positive SARS-CoV-2 PCR 1/4. Note somewhat delayed presentation as patient has been short of breath, taking off CPAP to breathe while sleeping for days. - Received tocilizumab 1/5.  - Completed remdesivir 1/5 - 1/9 - Continue decadron 1/5 - 1/14.  - PCT normalized, CRP significantly decreased. - CXR repeat exam showed better delineation of bilateral infiltrates appearing to be an interval worsening, though based on exam this represents delayed radiographic clearance. LLL appears better aerated on my review as well. Showed images and discussed with patient at the bedside. - Continue supplemental oxygen to maintain SpO2 >89% and/or normal respiratory effort.  - Prone, mobilize, IS, FV, monitor blood cultures. HIV NR.  - Added afrin for nasal congestion.  Insomnia:  - Trazodone appears to have improved symptoms, will continue. Improved.    Anxiety:  - Improving with decreased steroid dose and prn clonazepam without sedation.  AKI: Improved.  - Monitor, stable.  Obesity: BMI 38. Noted.   HLD:  - Stopped statin with mild rise in LFTs. Can likely restart soon.  LFT elevation: Very mild, due to viral infection +/- remdesivir, tocilizumab. Sustaining improvement - Continue monitoring intermittently.  Covid-19 gastroenteritis:  - Symptomatic management, resolved.  OSA: With need for continuing CPAP here.  - Ordered CPAP qHS for OSA indication (not acute respiratory failure). D/w PCCM. 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 D/w RN.  GERD:  - Continue PPI  DVT prophylaxis: Lovenox 0.5mg /kg q24h Code Status: Full Family Communication: Wife by phone daily Disposition Plan: Likely to return home +/- HH once hypoxia significantly improved. Given improvement over past 24-48 hours, discharge 1/13 is likely.  Consultants:   None  Procedures:   None  Antimicrobials:  Remdesivir, ceftriaxone, azithromycin 1/5 - 1/9  Subjective: Again noted significant improvements in dyspnea and hypoxia. Slept well. No new complaints.   Objective: Vitals:   11/26/19 1000 11/26/19 1034 11/26/19 1414 11/26/19 1445  BP:    121/71  Pulse: 97 94 87 93  Resp: (!) 27 16 (!) 25 (!) 22  Temp:    98.3 F (36.8 C)  TempSrc:    Oral  SpO2: 100% 94% 92% 93%  Weight:      Height:       Gen: 54 y.o. male in no distress Pulm: Nonlabored, slightly tachypneic. Cleared crackles. CV: Regular rate and rhythm. No murmur, rub, or gallop. No JVD, no dependent edema. GI: Abdomen soft, non-tender, non-distended, with normoactive bowel sounds.  Ext: Warm, no deformities Skin: No rashes,  lesions or ulcers on visualized skin. Neuro: Alert and oriented. No focal neurological deficits. Psych: Judgement and insight appear fair. Mood euthymic & affect congruent. Behavior is appropriate.    Data Reviewed: I have  personally reviewed following labs and imaging studies  CBC: Recent Labs  Lab 11/20/19 0000 11/21/19 0530 11/22/19 0200 11/25/19 0355  WBC 8.7 8.0 9.0 9.8  NEUTROABS 7.7 6.5 6.5 6.7  HGB 12.6* 14.1 14.7 15.8  HCT 38.1* 43.0 44.5 48.0  MCV 86.6 86.9 87.3 86.8  PLT 196 321 357 [id number]*   Basic Metabolic Panel: Recent Labs  Lab 11/21/19 0530 11/22/19 0200 11/23/19 0522 11/24/19 0335 11/25/19 0355  NA 139 139 139 137 140  K 4.3 4.2 4.3 3.8 3.7  CL 104 101 102 100 99  CO2 21* 25 25 25 25   GLUCOSE 128* 118* 122* 100* 93  BUN 19 20 23* 22* 19  CREATININE 1.21 1.15 1.14 1.14 1.16  CALCIUM 8.7* 8.8* 9.0 8.8* 8.7*   Liver Function Tests: Recent Labs  Lab 11/21/19 0530 11/22/19 0200 11/23/19 0522 11/24/19 0335 11/25/19 0355  AST 68* 79* 83* 61* 40  ALT 47* 62* 90* 87* 66*  ALKPHOS 59 49 47 51 53  BILITOT 0.4 0.9 0.8 0.6 1.0  PROT 7.0 7.0 6.7 6.7 6.8  ALBUMIN 3.3* 3.5 3.5 3.5 3.6   Anemia Panel: No results for input(s): VITAMINB12, FOLATE, FERRITIN, TIBC, IRON, RETICCTPCT in the last 72 hours. Recent Results (from the past 240 hour(s))  Respiratory Panel by RT PCR (Flu A&B, Covid) - Nasopharyngeal Swab     Status: Abnormal   Collection Time: 11/18/19 11:42 PM   Specimen: Nasopharyngeal Swab  Result Value Ref Range Status   SARS Coronavirus 2 by RT PCR POSITIVE (A) NEGATIVE Final    Comment: CRITICAL RESULT CALLED TO, READ BACK BY AND VERIFIED WITH: FARRAR,S @ 0307 ON [id number] BY POTEAT,S (NOTE) SARS-CoV-2 target nucleic acids are DETECTED. SARS-CoV-2 RNA is generally detectable in upper respiratory specimens  during the acute phase of infection. Positive results are indicative of the presence of the identified virus, but do not rule out bacterial infection or co-infection with other pathogens not detected by the test. Clinical correlation with patient history and other diagnostic information is necessary to determine patient infection status. The expected result is  Negative. Fact Sheet for Patients:  PinkCheek.be Fact Sheet for Healthcare Providers: GravelBags.it This test is not yet approved or cleared by the [place] FDA and  has been authorized for detection and/or diagnosis of SARS-CoV-2 by FDA under an Emergency Use Authorization (EUA).  This EUA will remain in effect (meaning this test can  be used) for the duration of  the COVID-19 declaration under Section 564(b)(1) of the Act, 21 U.S.C. section 360bbb-3(b)(1), unless the authorization is terminated or revoked sooner.    Influenza A by PCR NEGATIVE NEGATIVE Final   Influenza B by PCR NEGATIVE NEGATIVE Final    Comment: (NOTE) The Xpert Xpress SARS-CoV-2/FLU/RSV assay is intended as an aid in  the diagnosis of influenza from Nasopharyngeal swab specimens and  should not be used as a sole basis for treatment. Nasal washings and  aspirates are unacceptable for Xpert Xpress SARS-CoV-2/FLU/RSV  testing. Fact Sheet for Patients: PinkCheek.be Fact Sheet for Healthcare Providers: GravelBags.it This test is not yet approved or cleared by the [place] FDA and  has been authorized for detection and/or diagnosis of SARS-CoV-2 by  FDA under an Emergency Use Authorization (EUA). This EUA will remain  in  effect (meaning this test can be used) for the duration of the  Covid-19 declaration under Section 564(b)(1) of the Act, 21  U.S.C. section 360bbb-3(b)(1), unless the authorization is  terminated or revoked. Performed at [facility], [place] [address] [place], [place] [address]   Blood Culture (routine x 2)     Status: None   Collection Time: 11/18/19 11:46 PM   Specimen: BLOOD  Result Value Ref Range Status   Specimen Description   Final    BLOOD LEFT ANTECUBITAL Performed at [place] [address] [place], [place] [address]     Special Requests   Final    BOTTLES DRAWN AEROBIC AND ANAEROBIC Blood Culture adequate volume Performed at [place] [address], [place], [place] [address]    Culture   Final    NO GROWTH 5 DAYS Performed at [facility], [place] [address], [place], [place] [address]    Report Status 11/24/2019 FINAL  Final  Blood Culture (routine x 2)     Status: None   Collection Time: 11/18/19 11:51 PM   Specimen: BLOOD  Result Value Ref Range Status   Specimen Description   Final    BLOOD RIGHT ANTECUBITAL Performed at [place] [address], [place], [place] [address]    Special Requests   Final    BOTTLES DRAWN AEROBIC AND ANAEROBIC Blood Culture adequate volume Performed at [place] [address] [place], [place] [address]    Culture   Final    NO GROWTH 5 DAYS Performed at [facility], [place] [address], [place], [place] [address]    Report Status 11/24/2019 FINAL  Final      Radiology Studies: cxr 1v 5am  Result Date: 11/25/2019 CLINICAL DATA:  Acute respiratory failure due to COVID-19. EXAM: CHEST  1 VIEW COMPARISON:  November 18, 2019. FINDINGS: Stable cardiomediastinal silhouette. Significantly increased bilateral peripheral lung opacities are noted consistent with multifocal pneumonia, potentially of viral etiology. No pneumothorax or pleural effusion is noted. Bony thorax is unremarkable. IMPRESSION: Significantly increased bilateral peripheral lung opacities are noted consistent with multifocal pneumonia, potentially of viral etiology. Followup radiographs are recommended until resolution. Electronically Signed   By: Marijo Conception M.D.   On: 11/25/2019 07:58    Scheduled Meds: . dexamethasone (DECADRON) injection  6 mg Intravenous Q24H  . dextromethorphan-guaiFENesin  1 tablet Oral BID  . enoxaparin (LOVENOX) injection  0.5 mg/kg Subcutaneous Daily  . ipratropium  2 puff Inhalation Q4H  .  loratadine  10 mg Oral Daily  . pantoprazole  40 mg Oral Daily  . traZODone  100 mg Oral QHS   Continuous Infusions:    LOS: 7 days   Time spent: 25 minutes.  Patrecia Pour, MD [facility] [email or web address] 11/26/2019, 2:50 PM

## 2019-11-26 NOTE — Progress Notes (Signed)
Patient arrived to 321, vitals and quick assessment performed. Writer agrees with previous nurses assessment. Vitals taken. Supper tray delivered. Lomotil given per request for loose stools. Denies needs. Report given to oncoming nurse.

## 2019-11-26 NOTE — Plan of Care (Signed)
  Problem: Education: Goal: Knowledge of risk factors and measures for prevention of condition will improve Outcome: Progressing   Problem: Coping: Goal: Psychosocial and spiritual needs will be supported Outcome: Progressing   Problem: Respiratory: Goal: Will maintain a patent airway Outcome: Progressing Goal: Complications related to the disease process, condition or treatment will be avoided or minimized Outcome: Progressing   

## 2019-11-26 NOTE — Progress Notes (Signed)
Wife updated regarding plan of care and condition today. All questions answered.

## 2019-11-27 ENCOUNTER — Inpatient Hospital Stay (HOSPITAL_COMMUNITY): Payer: BC Managed Care – PPO

## 2019-11-27 DIAGNOSIS — R7989 Other specified abnormal findings of blood chemistry: Secondary | ICD-10-CM

## 2019-11-27 DIAGNOSIS — U071 COVID-19: Secondary | ICD-10-CM

## 2019-11-27 LAB — COMPREHENSIVE METABOLIC PANEL
ALT: 49 U/L — ABNORMAL HIGH (ref 0–44)
AST: 29 U/L (ref 15–41)
Albumin: 3.5 g/dL (ref 3.5–5.0)
Alkaline Phosphatase: 48 U/L (ref 38–126)
Anion gap: 11 (ref 5–15)
BUN: 21 mg/dL — ABNORMAL HIGH (ref 6–20)
CO2: 22 mmol/L (ref 22–32)
Calcium: 9 mg/dL (ref 8.9–10.3)
Chloride: 104 mmol/L (ref 98–111)
Creatinine, Ser: 1.22 mg/dL (ref 0.61–1.24)
GFR calc Af Amer: >60 mL/min (ref >60)
GFR calc non Af Amer: >60 mL/min (ref >60)
Glucose, Bld: 131 mg/dL — ABNORMAL HIGH (ref 70–99)
Potassium: 3.3 mmol/L — ABNORMAL LOW (ref 3.5–5.1)
Sodium: 137 mmol/L (ref 135–145)
Total Bilirubin: 1.2 mg/dL (ref 0.3–1.2)
Total Protein: 6.4 g/dL — ABNORMAL LOW (ref 6.5–8.1)

## 2019-11-27 LAB — D-DIMER, QUANTITATIVE: D-Dimer, Quant: 3.14 ug/mL-FEU — ABNORMAL HIGH (ref 0.00–0.50)

## 2019-11-27 LAB — C-REACTIVE PROTEIN: CRP: 0.6 mg/dL (ref <1.0)

## 2019-11-27 MED ORDER — IOHEXOL 350 MG/ML SOLN
80.0000 mL | Freq: Once | INTRAVENOUS | Status: AC | PRN
  Administered 2019-11-27: 80 mL via INTRAVENOUS

## 2019-11-27 NOTE — Progress Notes (Signed)
Bilateral lower extremity venous duplex has been completed. Preliminary results can be found in CV Proc through chart review.   11/27/19 1:50 PM Carlos Levering RVT

## 2019-11-27 NOTE — Evaluation (Signed)
Physical Therapy Evaluation Patient Details Name: Adam [name] MRN: [id number] DOB: May 12, 1966 Today's Date: 11/27/2019   History of Present Illness  Pt is a 54 year old with a history including HLD and OSA who presented to [facility] 1/5 with a week of diarrhea and subsequent cough with dyspnea found to be hypoxic , febrile with bilateral infiltrates on CXR and positive SARS-CoV-2 testing. Admitted due to Acute hypoxic respiratory failure due to covid-19 pneumonia.   Clinical Impression  The patient pleased to be able to ambulate in hall. Patient ambulated x 260' on RA with SPO2 > 94%, HR 113( ear sensor). After return to room, sensor not showing smoothe pleth, SPO2 down to 87%, Patient's  Dyspnea 2/4.[name] Gradually able to get a good reading on finger and ear-95%. Reading. The patient is independent in room. Will pass to mobility team for ambulation , hopefully to DC soon. No further  Acute PT needs. Patient just needs to ambulate. PT signing off.    Follow Up Recommendations No PT follow up    Equipment Recommendations  None recommended by PT    Recommendations for Other Services       Precautions / Restrictions Precautions Precaution Comments: monitor sats on RA      Mobility  Bed Mobility Overal bed mobility: Independent                Transfers Overall transfer level: Independent Equipment used: None                Ambulation/Gait Ambulation/Gait assistance: Independent Gait Distance (Feet): 260 Feet Assistive device: None Gait Pattern/deviations: Step-through pattern   Gait velocity interpretation: 1.31 - 2.62 ft/sec, indicative of limited community Conservation officer, historic buildings [place] (Stroke Patients Only)       Balance Overall balance assessment: Independent                                           Pertinent Vitals/Pain Pain Assessment: No/denies pain    Home Living Family/patient expects  to be discharged to:: Private residence Living Arrangements: Spouse/significant other;Children Available Help at Discharge: Family Type of Home: House       Home Layout: Two level;Able to live on main level with bedroom/bathroom Home Equipment: None      Prior Function Level of Independence: Independent               Hand Dominance        Extremity/Trunk Assessment   Upper Extremity Assessment Upper Extremity Assessment: (noted mild tremor/shaking-pt. states that he is cold.)    Lower Extremity Assessment Lower Extremity Assessment: Overall WFL for tasks assessed    Cervical / Trunk Assessment Cervical / Trunk Assessment: Normal  Communication   Communication: No difficulties  Cognition Arousal/Alertness: Awake/alert Behavior During Therapy: WFL for tasks assessed/performed Overall Cognitive Status: Within Functional Limits for tasks assessed                                        General Comments      Exercises Other Exercises Other Exercises: encouraged IS and flutter, ambulatye frequently at home.   Assessment/Plan    PT Assessment Patent does not need any further PT services(can amb.  w. other staff- mobility.)  PT Problem List         PT Treatment Interventions      PT Goals (Current goals can be found in the Care Plan section)  Acute Rehab PT Goals Patient Stated Goal: to get well, go home PT Goal Formulation: All assessment and education complete, DC therapy    Frequency     Barriers to discharge        Co-evaluation               AM-PAC PT "6 Clicks" Mobility  Outcome Measure Help needed turning from your back to your side while in a flat bed without using bedrails?: None Help needed moving from lying on your back to sitting on the side of a flat bed without using bedrails?: None Help needed moving to and from a bed to a chair (including a wheelchair)?: None Help needed standing up from a chair using your arms  (e.g., wheelchair or bedside chair)?: None Help needed to walk in hospital room?: None Help needed climbing 3-5 steps with a railing? : A Little 6 Click Score: 23    End of Session   Activity Tolerance: Patient tolerated treatment well Patient left: in chair;with call bell/phone within reach Nurse Communication: Mobility status PT Visit Diagnosis: Difficulty in walking, not elsewhere classified (R26.2)    Time: [id number] PT Time Calculation (min) (ACUTE ONLY): 26 min   Charges:   PT Evaluation $PT Eval Low Complexity: 1 Low PT Treatments $Gait Training: 8-22 mins        [place] Pager [phone] Office [phone]   Claretha Cooper 11/27/2019, 9:06 AM

## 2019-11-27 NOTE — Evaluation (Signed)
Occupational Therapy Evaluation Patient Details Name: Adam [name] MRN: [id number] DOB: 1966/02/02 Today's Date: 11/27/2019    History of Present Illness Pt is a 54 year old with a history including HLD and OSA who presented to [facility] 1/5 with a week of diarrhea and subsequent cough with dyspnea found to be hypoxic , febrile with bilateral infiltrates on CXR and positive SARS-CoV-2 testing. Admitted due to Acute hypoxic respiratory failure due to covid-19 pneumonia.    Clinical Impression   PTA Pt is manager at [employer], independent in all aspects of mobility and ADL/IADL. Today Pt is independent for transfers/mobility/ADL (able to perform LB and UB ADL - sink level grooming and sponge bath at sink) Pt does get DOE 2/4 with extended walking or with prolonged standing for grooming tasks/bathing. Pt provided with energy conservation strategies for ADL/IADL. No need for skilled occupational therapy.   Of note: When OT entered the room, finger pulse ox (clamp style) reading 87%. [employer] finger probe put on and with good pleth reading at 86-87%. 2L of O2 provided throughout [place], SpO2 went up to 90%. Pt on 2L O2 during in room mobility and standing grooming staying >88% on 2L. Then exchanged for earlobe probe, initial saturations on RA seated 89%, stayed >88% with 3 laps around unit (Pt DOE 2/4) on RA. When returned to sit in recliner, SpO2 reading 93% on RA. Educated Pt on need to monitor oxygen levels at home, and why it is important. MD and RN aware at this time.    Follow Up Recommendations  No OT follow up    Equipment Recommendations  None recommended by OT    Recommendations for Other Services       Precautions / Restrictions Precautions Precaution Comments: monitor sats on RA Restrictions Weight Bearing Restrictions: No      Mobility Bed Mobility Overal bed mobility: Independent             General bed mobility comments: OOB in recliner at beginning and end of  session  Transfers Overall transfer level: Independent Equipment used: None                  Balance Overall balance assessment: Independent                                         ADL either performed or assessed with clinical judgement   ADL Overall ADL's : Modified independent                                       General ADL Comments: no LOB, no problem opening containers, does get SOB (energy conservation education provided)      Vision Baseline Vision/History: Wears glasses Wears Glasses: At all times Patient Visual Report: No change from baseline       Perception     Praxis      Pertinent Vitals/Pain Pain Assessment: No/denies pain     Hand Dominance Right   Extremity/Trunk Assessment Upper Extremity Assessment Upper Extremity Assessment: Overall WFL for tasks assessed   Lower Extremity Assessment Lower Extremity Assessment: Overall WFL for tasks assessed   Cervical / Trunk Assessment Cervical / Trunk Assessment: Normal   Communication Communication Communication: No difficulties   Cognition Arousal/Alertness: Awake/alert Behavior During Therapy: WFL for tasks assessed/performed Overall Cognitive Status: Within  Functional Limits for tasks assessed                                     General Comments  DOE 2/4 with activity    Exercises Other Exercises Other Exercises: encouraged IS and flutter, ambulatye frequently at home.   Shoulder Instructions      Home Living Family/patient expects to be discharged to:: Private residence Living Arrangements: Spouse/significant other;Children(19, 17, 7) Available Help at Discharge: Family Type of Home: House Home Access: Stairs to enter [employer] of Steps: 3   Home Layout: Two level;Able to live on main level with bedroom/bathroom Alternate Level Stairs-Number of Steps: flight   Bathroom Shower/Tub: Walk-in shower;Tub/shower unit    [employer]: Standard     Home Equipment: Civil engineer, contracting - built in          Prior Functioning/Environment Level of Independence: Independent        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), drives        OT Problem List: Cardiopulmonary status limiting activity;Decreased activity tolerance      OT Treatment/Interventions:      OT Goals(Current goals can be found in the care plan section) Acute Rehab OT Goals Patient Stated Goal: to get well, go home OT Goal Formulation: With patient Time For Goal Achievement: 12/11/19 Potential to Achieve Goals: Good  OT Frequency:     Barriers to D/C:            Co-evaluation              AM-PAC OT "6 Clicks" Daily Activity     Outcome Measure Help from another person eating meals?: None Help from another person taking care of personal grooming?: None Help from another person toileting, which includes using toliet, bedpan, or urinal?: None Help from another person bathing (including washing, rinsing, drying)?: None Help from another person to put on and taking off regular upper body clothing?: None Help from another person to put on and taking off regular lower body clothing?: None 6 Click Score: 24   End of Session Equipment Utilized During Treatment: Oxygen Nurse Communication: Mobility status  Activity Tolerance: Patient tolerated treatment well Patient left: in chair;with call bell/phone within reach  OT Visit Diagnosis: Muscle weakness (generalized) (M62.81)                Time: [id number] OT Time Calculation (min): 40 min Charges:  OT General Charges $OT Visit: 1 Visit OT Evaluation $OT Eval Low Complexity: 1 Low OT Treatments $Self Care/Home Management : 8-22 mins $Therapeutic Activity: 8-22 mins  Jesse Sans OTR/L Acute Rehabilitation Services Pager: [phone] Office: [phone]  Mickel Baas  11/27/2019, 11:39 AM

## 2019-11-27 NOTE — Progress Notes (Signed)
PROGRESS NOTE  Adam [name]  [id number] DOB: Jan 25, 1966 DOA: 11/18/2019 PCP: Susy Frizzle, MD   Brief Narrative: Adam [name] 54 year old with a history of HLD and OSA who presented to [facility] 1/5 with a week of diarrhea and subsequent cough with dyspnea found to be hypoxic , febrile with bilateral infiltrates on CXR and positive SARS-CoV-2 testing. He was admitted to [facility] and received a dose of tocilizumab due to markedly elevated inflammatory markers (CRP 34.8). Due to right midlung zone infiltrate, ceftriaxone and azithromycin were added, remdesivir and steroids continued. Inflammatory markers have improved significantly though hypoxemia has been more stubborn. Having completed full courses of antibiotics and antiviral therapy, oxygen requirement appears to be improving.  Subjective: Patient reports his dyspnea has significantly improved, denies any chest pain, leg swelling or pain.  Assessment & Plan: Principal Problem:   Acute respiratory failure due to COVID-19 [facility]) Active Problems:   OSA (obstructive sleep apnea)   ARF (acute renal failure) (HCC)  Acute hypoxic respiratory failure due to covid-19 pneumonia, superimposed RML bacterial CAP: BNP wnl. Tn normal. Positive SARS-CoV-2 PCR 1/4. Note somewhat delayed presentation as patient has been short of breath, taking off CPAP to breathe while sleeping for days. - Received tocilizumab 1/5.  - Completed remdesivir 1/5 - 1/9 - Continue decadron 1/5 - 1/14.  - PCT normalized, CRP significantly decreased. -Patient was encouraged to use incentive spirometry, flutter valve, and encouraged to get out of bed to chair  Elevated D-dimers -D-dimers trending up, it is 3.1 today, venous Dopplers with no evidence of DVT, overall D-dimers trending up since admission, I will go ahead and obtain CTA chest to rule out PE  Insomnia:  - Trazodone appears to have improved symptoms, will continue. Improved.   Anxiety:  - Improving with decreased  steroid dose and prn clonazepam without sedation.  AKI: Improved.  - Monitor, stable.  Obesity: BMI 38. Noted.   HLD:  - Stopped statin with mild rise in LFTs. Can likely restart soon.  LFT elevation: Very mild, due to viral infection +/- remdesivir, tocilizumab. Sustaining improvement - Continue monitoring intermittently.  Covid-19 gastroenteritis:  - Symptomatic management, resolved.  OSA: With need for continuing CPAP here.  - Ordered CPAP qHS for OSA indication (not acute respiratory failure). D/w PCCM. 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 D/w RN.  GERD:  - Continue PPI  DVT prophylaxis: Lovenox 0.5mg /kg q24h Code Status: Full Family Communication: d/w patient Disposition Plan: Home Consultants:   None  Procedures:   None  Antimicrobials:  Remdesivir, ceftriaxone, azithromycin 1/5 - 1/9    Objective: Vitals:   11/26/19 1700 11/26/19 2032 11/26/19 2044 11/27/19 0427  BP: 118/73 130/67  (!) 97/58  Pulse: 91 87  77  Resp: 18 18 19 18   Temp: 98.1 F (36.7 C) (!) 97.5 F (36.4 C)  98.4 F (36.9 C)  TempSrc: Oral Oral  Oral  SpO2: 98% 90% 91% (!) 88%  Weight:      Height:        Awake Alert, Oriented X 3, No new F.N deficits, Normal affect Symmetrical Chest wall movement, Good air movement bilaterally, CTAB RRR,No Gallops,Rubs or new Murmurs, No Parasternal Heave +ve B.Sounds, Abd Soft, No tenderness, No rebound - guarding or rigidity. No Cyanosis, Clubbing or edema, No new Rash or bruise     Data Reviewed: I have personally reviewed following labs and imaging studies  CBC: Recent Labs  Lab 11/21/19 0530 11/22/19 0200 11/25/19 0355  WBC 8.0 9.0 9.8  NEUTROABS 6.5 6.5 6.7  HGB 14.1 14.7 15.8  HCT 43.0 44.5 48.0  MCV 86.9 87.3 86.8  PLT 321 357 [id number]*   Basic Metabolic Panel: Recent Labs  Lab 11/22/19 0200 11/23/19 0522 11/24/19 0335 11/25/19 0355 11/27/19 0940  NA 139 139 137 140 137  K 4.2 4.3 3.8 3.7  3.3*  CL 101 102 100 99 104  CO2 25 25 25 25 22   GLUCOSE 118* 122* 100* 93 131*  BUN 20 23* 22* 19 21*  CREATININE 1.15 1.14 1.14 1.16 1.22  CALCIUM 8.8* 9.0 8.8* 8.7* 9.0   Liver Function Tests: Recent Labs  Lab 11/22/19 0200 11/23/19 0522 11/24/19 0335 11/25/19 0355 11/27/19 0940  AST 79* 83* 61* 40 29  ALT 62* 90* 87* 66* 49*  ALKPHOS 49 47 51 53 48  BILITOT 0.9 0.8 0.6 1.0 1.2  PROT 7.0 6.7 6.7 6.8 6.4*  ALBUMIN 3.5 3.5 3.5 3.6 3.5   Anemia Panel: No results for input(s): VITAMINB12, FOLATE, FERRITIN, TIBC, IRON, RETICCTPCT in the last 72 hours. Recent Results (from the past 240 hour(s))  Respiratory Panel by RT PCR (Flu A&B, Covid) - Nasopharyngeal Swab     Status: Abnormal   Collection Time: 11/18/19 11:42 PM   Specimen: Nasopharyngeal Swab  Result Value Ref Range Status   SARS Coronavirus 2 by RT PCR POSITIVE (A) NEGATIVE Final    Comment: CRITICAL RESULT CALLED TO, READ BACK BY AND VERIFIED WITH: FARRAR,S @ 0307 ON [id number] BY POTEAT,S (NOTE) SARS-CoV-2 target nucleic acids are DETECTED. SARS-CoV-2 RNA is generally detectable in upper respiratory specimens  during the acute phase of infection. Positive results are indicative of the presence of the identified virus, but do not rule out bacterial infection or co-infection with other pathogens not detected by the test. Clinical correlation with patient history and other diagnostic information is necessary to determine patient infection status. The expected result is Negative. Fact Sheet for Patients:  PinkCheek.be Fact Sheet for Healthcare Providers: GravelBags.it This test is not yet approved or cleared by the [place] FDA and  has been authorized for detection and/or diagnosis of SARS-CoV-2 by FDA under an Emergency Use Authorization (EUA).  This EUA will remain in effect (meaning this test can  be used) for the duration of  the COVID-19 declaration  under Section 564(b)(1) of the Act, 21 U.S.C. section 360bbb-3(b)(1), unless the authorization is terminated or revoked sooner.    Influenza A by PCR NEGATIVE NEGATIVE Final   Influenza B by PCR NEGATIVE NEGATIVE Final    Comment: (NOTE) The Xpert Xpress SARS-CoV-2/FLU/RSV assay is intended as an aid in  the diagnosis of influenza from Nasopharyngeal swab specimens and  should not be used as a sole basis for treatment. Nasal washings and  aspirates are unacceptable for Xpert Xpress SARS-CoV-2/FLU/RSV  testing. Fact Sheet for Patients: PinkCheek.be Fact Sheet for Healthcare Providers: GravelBags.it This test is not yet approved or cleared by the [place] FDA and  has been authorized for detection and/or diagnosis of SARS-CoV-2 by  FDA under an Emergency Use Authorization (EUA). This EUA will remain  in effect (meaning this test can be used) for the duration of the  Covid-19 declaration under Section 564(b)(1) of the Act, 21  U.S.C. section 360bbb-3(b)(1), unless the authorization is  terminated or revoked. Performed at [facility], [place] [address] [place], [place] [address]   Blood Culture (routine x 2)     Status: None   Collection  Time: 11/18/19 11:46 PM   Specimen: BLOOD  Result Value Ref Range Status   Specimen Description   Final    BLOOD LEFT ANTECUBITAL Performed at [facility] [facility] [address] [place], [place] [address]    Special Requests   Final    BOTTLES DRAWN AEROBIC AND ANAEROBIC Blood Culture adequate volume Performed at [facility] [address] [place], [place] [address]    Culture   Final    NO GROWTH 5 DAYS Performed at [facility], [place] [address] [place], [place] [address]    Report Status 11/24/2019 FINAL  Final  Blood Culture (routine x 2)     Status: None   Collection Time: 11/18/19 11:51 PM   Specimen: BLOOD  Result  Value Ref Range Status   Specimen Description   Final    BLOOD RIGHT ANTECUBITAL Performed at [facility] [address] [place], [place] [address]    Special Requests   Final    BOTTLES DRAWN AEROBIC AND ANAEROBIC Blood Culture adequate volume Performed at [facility] [address] [place], [place] [address]    Culture   Final    NO GROWTH 5 DAYS Performed at [facility], [place] [address], [place], [place] [address]    Report Status 11/24/2019 FINAL  Final      Radiology Studies: VAS [place] LOWER EXTREMITY VENOUS (DVT)  Result Date: 11/27/2019  Lower Venous Study Indications: Elevated Ddimer.  Risk Factors: COVID 19 positive. Comparison Study: No prior studies. Performing Technologist: Oliver Hum RVT  Examination Guidelines: A complete evaluation includes B-mode imaging, spectral Doppler, color Doppler, and power Doppler as needed of all accessible portions of each vessel. Bilateral testing is considered an integral part of a complete examination. Limited examinations for reoccurring indications may be performed as noted.  +---------+---------------+---------+-----------+----------+--------------+ RIGHT    CompressibilityPhasicitySpontaneityPropertiesThrombus Aging +---------+---------------+---------+-----------+----------+--------------+ CFV      Full           Yes      Yes                                 +---------+---------------+---------+-----------+----------+--------------+ SFJ      Full                                                        +---------+---------------+---------+-----------+----------+--------------+ FV Prox  Full                                                        +---------+---------------+---------+-----------+----------+--------------+ FV Mid   Full                                                        +---------+---------------+---------+-----------+----------+--------------+ FV  DistalFull                                                        +---------+---------------+---------+-----------+----------+--------------+  PFV      Full                                                        +---------+---------------+---------+-----------+----------+--------------+ POP      Full           Yes      Yes                                 +---------+---------------+---------+-----------+----------+--------------+ PTV      Full                                                        +---------+---------------+---------+-----------+----------+--------------+ PERO     Full                                                        +---------+---------------+---------+-----------+----------+--------------+   +---------+---------------+---------+-----------+----------+--------------+ LEFT     CompressibilityPhasicitySpontaneityPropertiesThrombus Aging +---------+---------------+---------+-----------+----------+--------------+ CFV      Full           Yes      Yes                                 +---------+---------------+---------+-----------+----------+--------------+ SFJ      Full                                                        +---------+---------------+---------+-----------+----------+--------------+ FV Prox  Full                                                        +---------+---------------+---------+-----------+----------+--------------+ FV Mid   Full                                                        +---------+---------------+---------+-----------+----------+--------------+ FV DistalFull                                                        +---------+---------------+---------+-----------+----------+--------------+ PFV      Full                                                        +---------+---------------+---------+-----------+----------+--------------+  POP      Full           Yes      Yes                                  +---------+---------------+---------+-----------+----------+--------------+ PTV      Full                                                        +---------+---------------+---------+-----------+----------+--------------+ PERO     Full                                                        +---------+---------------+---------+-----------+----------+--------------+     Summary: Right: There is no evidence of deep vein thrombosis in the lower extremity. No cystic structure found in the popliteal fossa. Left: There is no evidence of deep vein thrombosis in the lower extremity. No cystic structure found in the popliteal fossa.  *See table(s) above for measurements and observations.    Preliminary     Scheduled Meds: . dexamethasone (DECADRON) injection  6 mg Intravenous Q24H  . dextromethorphan-guaiFENesin  1 tablet Oral BID  . enoxaparin (LOVENOX) injection  0.5 mg/kg Subcutaneous Daily  . ipratropium  2 puff Inhalation Q4H  . loratadine  10 mg Oral Daily  . pantoprazole  40 mg Oral Daily  . traZODone  100 mg Oral QHS   Continuous Infusions:    LOS: 8 days   Time spent: 25 minutes.  Phillips Climes, MD [facility] [email or web address] 11/27/2019, 4:08 PM

## 2019-11-28 DIAGNOSIS — G4733 Obstructive sleep apnea (adult) (pediatric): Secondary | ICD-10-CM

## 2019-11-28 LAB — COMPREHENSIVE METABOLIC PANEL
ALT: 45 U/L — ABNORMAL HIGH (ref 0–44)
AST: 25 U/L (ref 15–41)
Albumin: 3.5 g/dL (ref 3.5–5.0)
Alkaline Phosphatase: 44 U/L (ref 38–126)
Anion gap: 12 (ref 5–15)
BUN: 21 mg/dL — ABNORMAL HIGH (ref 6–20)
CO2: 24 mmol/L (ref 22–32)
Calcium: 8.8 mg/dL — ABNORMAL LOW (ref 8.9–10.3)
Chloride: 101 mmol/L (ref 98–111)
Creatinine, Ser: 1.21 mg/dL (ref 0.61–1.24)
GFR calc Af Amer: >60 mL/min (ref >60)
GFR calc non Af Amer: >60 mL/min (ref >60)
Glucose, Bld: 97 mg/dL (ref 70–99)
Potassium: 3.8 mmol/L (ref 3.5–5.1)
Sodium: 137 mmol/L (ref 135–145)
Total Bilirubin: 1.3 mg/dL — ABNORMAL HIGH (ref 0.3–1.2)
Total Protein: 6.4 g/dL — ABNORMAL LOW (ref 6.5–8.1)

## 2019-11-28 LAB — D-DIMER, QUANTITATIVE: D-Dimer, Quant: 2.67 ug/mL-FEU — ABNORMAL HIGH (ref 0.00–0.50)

## 2019-11-28 MED ORDER — ASPIRIN EC 81 MG PO TBEC: 81.0000 mg | DELAYED_RELEASE_TABLET | Freq: Every day | ORAL | 0 refills | Status: AC

## 2019-11-28 MED ORDER — PANTOPRAZOLE SODIUM 40 MG PO TBEC: 40.0000 mg | DELAYED_RELEASE_TABLET | Freq: Every day | ORAL | 0 refills | Status: DC

## 2019-11-28 MED ORDER — DEXAMETHASONE 6 MG PO TABS: 6.0000 mg | ORAL_TABLET | Freq: Every day | ORAL | 0 refills | Status: DC

## 2019-11-28 MED ORDER — ACETAMINOPHEN 325 MG PO TABS: 650.0000 mg | ORAL_TABLET | Freq: Four times a day (QID) | ORAL | Status: DC | PRN

## 2019-11-28 NOTE — TOC Transition Note (Signed)
Transition of Care [facility]) - CM/SW Discharge Note   Patient Details  Name: Avante Wittkopp MRN: [id number] Date of Birth: 05-21-1966  Transition of Care [facility]) CM/SW Contact:  Ninfa Meeker, RN Phone Number: [phone] (working remotely) 11/28/2019, 12:31 PM   Clinical Narrative:  Case manager spoke with patient's wife, Jerilee Hoh, via telephone to discuss need for oxygen at discharge. She understands that [employer] will be delivering concentrator and tanks to the home and that her husband will be ready for discharge after. Referral for oxygen was called to Learta Codding, [employer] Case Manager contacted [facility] and requested a tank be delivered to patient's room .    Final next level of care: Home/Self Care Barriers to Discharge: No Barriers Identified   Patient Goals and CMS Choice     Choice offered to / list presented to : NA  Discharge Placement                       Discharge Plan and Services     Post Acute Care Choice: Durable Medical Equipment            DME Agency: [place] Date DME Agency Contacted: 11/28/19 Time DME Agency Contacted: 1215 Representative spoke with at DME Agency: Learta Codding [place] Day Arranged: NA          Social Determinants of Health ([place]) Interventions     Readmission Risk Interventions No flowsheet data found.

## 2019-11-28 NOTE — Progress Notes (Signed)
Patient oxygen saturation on room air at 85%, patient refused to put on his nasal cannula. I explained to that he needed to put his oxygen on to get his saturations up. He still refused and is in bed back asleep.

## 2019-11-28 NOTE — Discharge Planning (Addendum)
Patient IV x2 removed.  RN assessment and VS revealed stability for DC to home with home O2.  O2 delivered to rm and home prior to DC.  Discharge papers given, explained and educated.  Discussed need to self quarantine for 2 weeks beyond DC and to monitor self and others in the home for increased lethargicness, uncontrolled temps or difficulty breathing.  DC contract signed and placed in chart/ Told on 2week FU appt needed with PCP and scripts sent to pharmacy, per patient request.  Once ride arrives - will be wheeled to front and family transporting home via car.

## 2019-11-28 NOTE — Discharge Planning (Signed)
Patient 92% SPO2 on RA at rest. Ambulated in hall on RA and SPO2 fell to 84%. Ambulated in hall on 2L [place] and SPO2 maintained in low 90s. Patient will need O2 for activity at home if discharged.

## 2019-11-28 NOTE — Discharge Summary (Signed)
Adam [name], is a 54 y.o. male  DOB [birth year]  MRN [id number].  Admission date:  11/18/2019  Admitting Physician  Rise Patience, MD  Discharge Date:  11/28/2019   Primary MD  Susy Frizzle, MD  Recommendations for primary care physician for things to follow:  - please Check CBC, CMP during next visit    Admission Diagnosis  Hypoxia [R09.02] Community acquired pneumonia, unspecified laterality [J18.9] Acute respiratory failure due to COVID-19 ([place]) [U07.1, J96.00] COVID-19 [U07.1]   Discharge Diagnosis  Hypoxia [R09.02] Community acquired pneumonia, unspecified laterality [J18.9] Acute respiratory failure due to COVID-19 ([place]) [U07.1, J96.00] COVID-19 [U07.1]    Principal Problem:   Acute respiratory failure due to COVID-19 [facility]) Active Problems:   OSA (obstructive sleep apnea)   ARF (acute renal failure) ([place])      Past Medical History:  Diagnosis Date  . GERD (gastroesophageal reflux disease)   . 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 OSA (obstructive sleep apnea) 09/06/2016   AHI-30, CPAP    Past Surgical History:  Procedure Laterality Date  . DG 4TH DIGIT RIGHT HAND    . HERNIA REPAIR    . TONSILLECTOMY    . WISDOM TOOTH EXTRACTION         History of present illness and  Hospital Course:     Kindly see H&P for history of present illness and admission details, please review complete Labs, Consult reports and Test reports for all details in brief  HPI  from the history and physical done on the day of admission 11/17/2025   HPI: Adam [name] is a 54 y.o. male with history of hyperlipidemia sleep apnea presented to the ER with complaint of shortness of breath.  Patient states he started having severe diarrhea over week ago.  Over the last 2 days patient has been clinically increasing short of breath with nonproductive cough and decided to come to the  ER.  Denies chest pain.  Patient states many people in his workplace has been diagnosed with COVID-19 infection.  ED Course: In the ER patient is requiring 4 L oxygen with temperature of 101.5 chest x-ray shows right-sided infiltrates and labs show creatinine of 1.4 BNP 70.3 high-sensitivity troponin was negative CRP 34.8 procalcitonin 0.46 WBC count 13.  D-dimer was 0.87 EKG shows right bundle branch block.  Initially patient was started on empiric antibiotics for community-acquired pneumonia later patient's Covid test came back positive and patient is started on IV remdesivir Decadron and patient gave consent for starting Actemra after discussing with patient about the off label use of Actemra for COVID-19 infection also its side effects and contraindications.  Hospital Course   Adam [name] 54 year old with a history of HLD and OSA who presented to [facility] 1/[facility] with a week of diarrhea and subsequent cough with dyspnea found to be hypoxic , febrile with bilateral infiltrates on CXR and positive SARS-CoV-2 testing. He was admitted to [facility] and received a dose of tocilizumab due to markedly elevated  inflammatory markers (CRP 34.8). Due to right midlung zone infiltrate, ceftriaxone and azithromycin were added, remdesivir and steroids continued. Inflammatory markers have improved significantly though hypoxemia has been more stubborn. Having completed full courses of antibiotics and antiviral therapy, oxygen requirement appears to be improving.  Acute hypoxic respiratory failure due to covid-19 pneumonia, superimposed RML bacterial CAP: BNP wnl. Tn normal. Positive SARS-CoV-2 PCR 1/4. Note somewhat delayed presentation as patient has been short of breath, taking  CPAP to breathe while sleeping for days. - Received tocilizumab 1/5.  - Completed remdesivir 1/5 - 1/9 -He was treated with Decadron, still having some oxygen requirement, so we will give another 5 days on discharge - PCT normalized, CRP significantly  decreased. -Patient was encouraged to use incentive spirometry, flutter valve, when he goes home, and instructed to stay active and ambulatory. -Patient still requiring 2 L nasal cannula, maintain activity on discharge, home oxygen will be arranged.  Elevated D-dimers -Dimers elevated, peaked at 3.1, venous Dopplers negative for DVT, CTA chest negative for PE, trending down the day of discharge, it is 2.6 today, instructed him to take baby aspirin for total of 14 days.  Insomnia:    Anxiety:   AKI: Improved. .  Obesity: BMI 38. Noted.   HLD:  - Stopped statin with mild rise in LFTs. Can likely restart soon.  LFT elevation: Secondary to COVID-19 infection, improving  Covid-19 gastroenteritis:  - Symptomatic management, resolved.  OSA:  -CPAP at home  GERD:  - Continue PPI  Discharge Condition:  stable   Follow UP  Follow-up Information    Susy Frizzle, MD Follow up in 2 week(s).   Specialty: Family Medicine Contact information: [address] [place] [address] [phone]             Discharge Instructions  and  Discharge Medications    Discharge Instructions    Discharge instructions   Complete by: As directed    Follow with Primary MD Susy Frizzle, MD in 14 days   Get CBC, CMP,  checked  by Primary MD next visit.    Activity: As tolerated with Full fall precautions use walker/cane & assistance as needed   Disposition Home    Diet: Regular Diet  On your next visit with your primary care physician please Get Medicines reviewed and adjusted.   Please request your Prim.MD to go over all Hospital Tests and Procedure/Radiological results at the follow up, please get all Hospital records sent to your Prim MD by signing hospital release before you go home.   If you experience worsening of your admission symptoms, develop shortness of breath, life threatening emergency, suicidal or homicidal thoughts you must seek  medical attention immediately by calling 911 or calling your MD immediately  if symptoms less severe.  You Must read complete instructions/literature along with all the possible adverse reactions/side effects for all the Medicines you take and that have been prescribed to you. Take any new Medicines after you have completely understood and accpet all the possible adverse reactions/side effects.   Do not drive, operating heavy machinery, perform activities at heights, swimming or participation in water activities or provide baby sitting services if your were admitted for syncope or siezures until you have seen by Primary MD or a Neurologist and advised to do so again.  Do not drive when taking Pain medications.    Do not take more than prescribed Pain, Sleep and Anxiety Medications  Special Instructions: If you have  smoked or chewed Tobacco  in the last 2 yrs please stop smoking, stop any regular Alcohol  and or any Recreational drug use.  Wear Seat belts while driving.   Please note  You were cared for by a hospitalist during your hospital stay. If you have any questions about your discharge medications or the care you received while you were in the hospital after you are discharged, you can call the unit and asked to speak with the hospitalist on call if the hospitalist that took care of you is not available. Once you are discharged, your primary care physician will handle any further medical issues. Please note that NO REFILLS for any discharge medications will be authorized once you are discharged, as it is imperative that you return to your primary care physician (or establish a relationship with a primary care physician if you do not have one) for your aftercare needs so that they can reassess your need for medications and monitor your lab values.   Increase activity slowly   Complete by: As directed      Allergies as of 11/28/2019   No Known Allergies     Medication List    STOP  taking these medications   ciprofloxacin 500 MG tablet Commonly known as: Cipro   meloxicam 15 MG tablet Commonly known as: MOBIC   metroNIDAZOLE 500 MG tablet Commonly known as: FLAGYL   omeprazole 40 MG capsule Commonly known as: PRILOSEC     TAKE these medications   acetaminophen 325 MG tablet Commonly known as: TYLENOL Take 2 tablets (650 mg total) by mouth every 6 (six) hours as needed for mild pain (or Fever >/= 101).   aspirin EC 81 MG tablet Take 1 tablet (81 mg total) by mouth daily for 15 days.   dexamethasone 6 MG tablet Commonly known as: DECADRON Take 1 tablet (6 mg total) by mouth daily. Start taking on: November 29, 2019   fenofibrate 160 MG tablet TAKE 1 TABLET BY MOUTH EVERY DAY   OVER THE COUNTER MEDICATION Take 1 tablet by mouth daily. Multi Vitamin - [employer] Brand   pantoprazole 40 MG tablet Commonly known as: PROTONIX Take 1 tablet (40 mg total) by mouth daily. Start taking on: November 29, 2019   vitamin C 1000 MG tablet Take 1,000 mg by mouth 2 (two) times daily.   Vitamin D 50 MCG (2000 UT) Caps Take by mouth.            Durable Medical Equipment  (From admission, onward)         Start     Ordered   11/28/19 1201  DME Oxygen  Once    Question Answer Comment  Length of Need 6 Months   Mode or (Route) Nasal cannula   Liters per Minute 2   Frequency Continuous (stationary and portable oxygen unit needed)   Oxygen conserving device Yes   Oxygen delivery system Gas      11/28/19 1201            Diet and Activity recommendation: See Discharge Instructions above   Consults obtained -  None   Major procedures and Radiology Reports - PLEASE review detailed and final reports for all details, in brief -    cxr 1v 5am  Result Date: 11/25/2019 CLINICAL DATA:  Acute respiratory failure due to COVID-19. EXAM: CHEST  1 VIEW COMPARISON:  November 18, 2019. FINDINGS: Stable cardiomediastinal silhouette. Significantly increased  bilateral peripheral lung opacities are noted consistent with multifocal  pneumonia, potentially of viral etiology. No pneumothorax or pleural effusion is noted. Bony thorax is unremarkable. IMPRESSION: Significantly increased bilateral peripheral lung opacities are noted consistent with multifocal pneumonia, potentially of viral etiology. Followup radiographs are recommended until resolution. Electronically Signed   By: Marijo Conception M.D.   On: 11/25/2019 07:58   CT ANGIO CHEST PE W OR WO CONTRAST  Result Date: 11/27/2019 CLINICAL DATA:  54 year old male with shortness of breath and elevated D-dimer. EXAM: CT ANGIOGRAPHY CHEST WITH CONTRAST TECHNIQUE: Multidetector CT imaging of the chest was performed using the standard protocol during bolus administration of intravenous contrast. Multiplanar CT image reconstructions and MIPs were obtained to evaluate the vascular anatomy. CONTRAST:  5mL OMNIPAQUE IOHEXOL 350 MG/ML SOLN COMPARISON:  Chest radiograph 11/25/2019. FINDINGS: Cardiovascular: Top-normal cardiac size. No pericardial effusion. The thoracic aorta is unremarkable. The origins of the great vessels of the aortic arch appear patent. No CT evidence of pulmonary embolism. Mediastinum/Nodes: No hilar or mediastinal adenopathy. Esophagus and the thyroid gland are grossly unremarkable. No mediastinal fluid collection. Lungs/Pleura: Bilateral peripheral and subpleural patchy and streaky densities most consistent with multifocal pneumonia, likely viral or atypical in etiology including COVID-19. Clinical correlation is recommended. There is a subcentimeter right upper lobe calcified granuloma. There is no pleural effusion or pneumothorax. The central airways are patent. Upper Abdomen: Probable fatty infiltration of the liver. Musculoskeletal: Degenerative changes of the spine. No acute osseous pathology. Review of the MIP images confirms the above findings. IMPRESSION: 1. No CT evidence of pulmonary embolism.  2. Multifocal pneumonia. Clinical correlation and follow-up to resolution recommended. Electronically Signed   By: Anner Crete M.D.   On: 11/27/2019 17:25   DG Chest Port 1 View  Result Date: 11/19/2019 CLINICAL DATA:  Shortness of breath and fever EXAM: PORTABLE CHEST 1 VIEW COMPARISON:  None. FINDINGS: Increased opacity in the right mid lung. Normal cardiomediastinal contours. No pleural effusion pneumothorax. IMPRESSION: Increased opacity in the right mid lung, concerning for pneumonia. Electronically Signed   By: Ulyses Jarred M.D.   On: 11/19/2019 00:23   VAS [place] LOWER EXTREMITY VENOUS (DVT)  Result Date: 11/27/2019  Lower Venous Study Indications: Elevated Ddimer.  Risk Factors: COVID 19 positive. Comparison Study: No prior studies. Performing Technologist: Oliver Hum RVT  Examination Guidelines: A complete evaluation includes B-mode imaging, spectral Doppler, color Doppler, and power Doppler as needed of all accessible portions of each vessel. Bilateral testing is considered an integral part of a complete examination. Limited examinations for reoccurring indications may be performed as noted.  +---------+---------------+---------+-----------+----------+--------------+ RIGHT    CompressibilityPhasicitySpontaneityPropertiesThrombus Aging +---------+---------------+---------+-----------+----------+--------------+ CFV      Full           Yes      Yes                                 +---------+---------------+---------+-----------+----------+--------------+ SFJ      Full                                                        +---------+---------------+---------+-----------+----------+--------------+ FV Prox  Full                                                        +---------+---------------+---------+-----------+----------+--------------+  FV Mid   Full                                                         +---------+---------------+---------+-----------+----------+--------------+ FV DistalFull                                                        +---------+---------------+---------+-----------+----------+--------------+ PFV      Full                                                        +---------+---------------+---------+-----------+----------+--------------+ POP      Full           Yes      Yes                                 +---------+---------------+---------+-----------+----------+--------------+ PTV      Full                                                        +---------+---------------+---------+-----------+----------+--------------+ PERO     Full                                                        +---------+---------------+---------+-----------+----------+--------------+   +---------+---------------+---------+-----------+----------+--------------+ LEFT     CompressibilityPhasicitySpontaneityPropertiesThrombus Aging +---------+---------------+---------+-----------+----------+--------------+ CFV      Full           Yes      Yes                                 +---------+---------------+---------+-----------+----------+--------------+ SFJ      Full                                                        +---------+---------------+---------+-----------+----------+--------------+ FV Prox  Full                                                        +---------+---------------+---------+-----------+----------+--------------+ FV Mid   Full                                                        +---------+---------------+---------+-----------+----------+--------------+  FV DistalFull                                                        +---------+---------------+---------+-----------+----------+--------------+ PFV      Full                                                         +---------+---------------+---------+-----------+----------+--------------+ POP      Full           Yes      Yes                                 +---------+---------------+---------+-----------+----------+--------------+ PTV      Full                                                        +---------+---------------+---------+-----------+----------+--------------+ PERO     Full                                                        +---------+---------------+---------+-----------+----------+--------------+     Summary: Right: There is no evidence of deep vein thrombosis in the lower extremity. No cystic structure found in the popliteal fossa. Left: There is no evidence of deep vein thrombosis in the lower extremity. No cystic structure found in the popliteal fossa.  *See table(s) above for measurements and observations. Electronically signed by Harold Barban MD on 11/27/2019 at 8:32:47 PM.    Final     Micro Results     Recent Results (from the past 240 hour(s))  Respiratory Panel by RT PCR (Flu A&B, Covid) - Nasopharyngeal Swab     Status: Abnormal   Collection Time: 11/18/19 11:42 PM   Specimen: Nasopharyngeal Swab  Result Value Ref Range Status   SARS Coronavirus 2 by RT PCR POSITIVE (A) NEGATIVE Final    Comment: CRITICAL RESULT CALLED TO, READ BACK BY AND VERIFIED WITH: FARRAR,S @ 0307 ON [id number] BY POTEAT,S (NOTE) SARS-CoV-2 target nucleic acids are DETECTED. SARS-CoV-2 RNA is generally detectable in upper respiratory specimens  during the acute phase of infection. Positive results are indicative of the presence of the identified virus, but do not rule out bacterial infection or co-infection with other pathogens not detected by the test. Clinical correlation with patient history and other diagnostic information is necessary to determine patient infection status. The expected result is Negative. Fact Sheet for Patients:  PinkCheek.be Fact  Sheet for Healthcare Providers: GravelBags.it This test is not yet approved or cleared by the [place] FDA and  has been authorized for detection and/or diagnosis of SARS-CoV-2 by FDA under an Emergency Use Authorization (EUA).  This EUA will remain in effect (meaning this test can  be used) for the duration of  the COVID-19 declaration under  Section 564(b)(1) of the Act, 21 U.S.C. section 360bbb-3(b)(1), unless the authorization is terminated or revoked sooner.    Influenza A by PCR NEGATIVE NEGATIVE Final   Influenza B by PCR NEGATIVE NEGATIVE Final    Comment: (NOTE) The Xpert Xpress SARS-CoV-2/FLU/RSV assay is intended as an aid in  the diagnosis of influenza from Nasopharyngeal swab specimens and  should not be used as a sole basis for treatment. Nasal washings and  aspirates are unacceptable for Xpert Xpress SARS-CoV-2/FLU/RSV  testing. Fact Sheet for Patients: PinkCheek.be Fact Sheet for Healthcare Providers: GravelBags.it This test is not yet approved or cleared by the [place] FDA and  has been authorized for detection and/or diagnosis of SARS-CoV-2 by  FDA under an Emergency Use Authorization (EUA). This EUA will remain  in effect (meaning this test can be used) for the duration of the  Covid-19 declaration under Section 564(b)(1) of the Act, 21  U.S.C. section 360bbb-3(b)(1), unless the authorization is  terminated or revoked. Performed at [facility], [place] [address], [place], [place] [address]   Blood Culture (routine x 2)     Status: None   Collection Time: 11/18/19 11:46 PM   Specimen: BLOOD  Result Value Ref Range Status   Specimen Description   Final    BLOOD LEFT ANTECUBITAL Performed at [facility] [address] [place], [place] [address]    Special Requests   Final    BOTTLES DRAWN AEROBIC AND ANAEROBIC Blood Culture  adequate volume Performed at [facility] [address], [place], [place] [address]    Culture   Final    NO GROWTH 5 DAYS Performed at [facility], [place] [address], [place], [place] [address]    Report Status 11/24/2019 FINAL  Final  Blood Culture (routine x 2)     Status: None   Collection Time: 11/18/19 11:51 PM   Specimen: BLOOD  Result Value Ref Range Status   Specimen Description   Final    BLOOD RIGHT ANTECUBITAL Performed at [facility] [address] [place], [place] [address]    Special Requests   Final    BOTTLES DRAWN AEROBIC AND ANAEROBIC Blood Culture adequate volume Performed at [facility] [address] [place],  [address]    Culture   Final    NO GROWTH 5 DAYS Performed at [facility], [place] [address], [place],  [address]    Report Status 11/24/2019 FINAL  Final       Today   Subjective:   Adam [name] today has no headache,no chest abdominal pain,no new weakness tingling or numbness, feels much better wants to go home today.   Objective:   Blood pressure (!) 101/53, pulse 74, temperature 98.6 F (37 C), temperature source Oral, resp. rate 17, height 6\' 1"  (1.854 m), weight 131.5 kg, SpO2 (!) 85 %.   Intake/Output Summary (Last 24 hours) at 11/28/2019 1201 Last data filed at 11/28/2019 0300 Gross per 24 hour  Intake 240 ml  Output --  Net 240 ml    Exam Awake Alert, Oriented x 3, No new F.N deficits, Normal affect Symmetrical Chest wall movement, Good air movement bilaterally, CTAB RRR,No Gallops,Rubs or new Murmurs, No Parasternal Heave +ve B.Sounds, Abd Soft, Non tender,  No rebound -guarding or rigidity. No Cyanosis, Clubbing or edema, No new Rash or bruise  Data Review   CBC w Diff:  Lab Results  Component Value  Date   WBC 9.8 11/25/2019   HGB 15.8 11/25/2019   HCT 48.0 11/25/2019   PLT 413 (H) 11/25/2019   LYMPHOPCT 15 11/25/2019   MONOPCT 7  11/25/2019   EOSPCT 1 11/25/2019   BASOPCT 1 11/25/2019    CMP:  Lab Results  Component Value Date   NA 137 11/28/2019   K 3.8 11/28/2019   CL 101 11/28/2019   CO2 24 11/28/2019   BUN 21 (H) 11/28/2019   CREATININE 1.21 11/28/2019   CREATININE 1.26 03/20/2019   PROT 6.4 (L) 11/28/2019   ALBUMIN 3.5 11/28/2019   BILITOT 1.3 (H) 11/28/2019   ALKPHOS 44 11/28/2019   AST 25 11/28/2019   ALT 45 (H) 11/28/2019  .   Total Time in preparing paper work, data evaluation and todays exam - 83 minutes  Phillips Climes M.D on 11/28/2019 at 12:01 PM  [facility]   Office  [phone]

## 2019-11-28 NOTE — Discharge Instructions (Signed)
Person Under Monitoring Name: Adam [name]  Location: [place]  [id number]   Infection Prevention Recommendations for Individuals Confirmed to have, or Being Evaluated for, 2019 Novel Coronavirus (COVID-19) Infection Who Receive Care at Home  Individuals who are confirmed to have, or are being evaluated for, COVID-19 should follow the prevention steps below until a healthcare provider or local or state health department says they can return to normal activities.  Stay home except to get medical care You should restrict activities outside your home, except for getting medical care. Do not go to work, school, or public areas, and do not use public transportation or taxis.  Call ahead before visiting your doctor Before your medical appointment, call the healthcare provider and tell them that you have, or are being evaluated for, COVID-19 infection. This will help the healthcare providers office take steps to keep other people from getting infected. Ask your healthcare provider to call the local or state health department.  Monitor your symptoms Seek prompt medical attention if your illness is worsening (e.g., difficulty breathing). Before going to your medical appointment, call the healthcare provider and tell them that you have, or are being evaluated for, COVID-19 infection. Ask your healthcare provider to call the local or state health department.  Wear a facemask You should wear a facemask that covers your nose and mouth when you are in the same room with other people and when you visit a healthcare provider. People who live with or visit you should also wear a facemask while they are in the same room with you.  Separate yourself from other people in your home As much as possible, you should stay in a different room from other people in your home. Also, you should use a separate bathroom, if available.  Avoid sharing household items You should not  share dishes, drinking glasses, cups, eating utensils, towels, bedding, or other items with other people in your home. After using these items, you should wash them thoroughly with soap and water.  Cover your coughs and sneezes Cover your mouth and nose with a tissue when you cough or sneeze, or you can cough or sneeze into your sleeve. Throw used tissues in a lined trash can, and immediately wash your hands with soap and water for at least 20 seconds or use an alcohol-based hand rub.  Wash your [facility] your hands often and thoroughly with soap and water for at least 20 seconds. You can use an alcohol-based hand sanitizer if soap and water are not available and if your hands are not visibly dirty. Avoid touching your eyes, nose, and mouth with unwashed hands.   Prevention Steps for Caregivers and Household Members of Individuals Confirmed to have, or Being Evaluated for, COVID-19 Infection Being Cared for in the Home  If you live with, or provide care at home for, a person confirmed to have, or being evaluated for, COVID-19 infection please follow these guidelines to prevent infection:  Follow healthcare providers instructions Make sure that you understand and can help the patient follow any healthcare provider instructions for all care.  Provide for the patients basic needs You should help the patient with basic needs in the home and provide support for getting groceries, prescriptions, and other personal needs.  Monitor the patients symptoms If they are getting sicker, call his or her medical provider and tell them that the patient has, or is being evaluated for, COVID-19 infection. This will help the healthcare providers  office take steps to keep other people from getting infected. Ask the healthcare provider to call the local or state health department.  Limit the number of people who have contact with the patient  If possible, have only one caregiver for the  patient.  Other household members should stay in another home or place of residence. If this is not possible, they should stay  in another room, or be separated from the patient as much as possible. Use a separate bathroom, if available.  Restrict visitors who do not have an essential need to be in the home.  Keep older adults, very young children, and other sick people away from the patient Keep older adults, very young children, and those who have compromised immune systems or chronic health conditions away from the patient. This includes people with chronic heart, lung, or kidney conditions, diabetes, and cancer.  Ensure good ventilation Make sure that shared spaces in the home have good air flow, such as from an air conditioner or an opened window, weather permitting.  Wash your hands often  Wash your hands often and thoroughly with soap and water for at least 20 seconds. You can use an alcohol based hand sanitizer if soap and water are not available and if your hands are not visibly dirty.  Avoid touching your eyes, nose, and mouth with unwashed hands.  Use disposable paper towels to dry your hands. If not available, use dedicated cloth towels and replace them when they become wet.  Wear a facemask and gloves  Wear a disposable facemask at all times in the room and gloves when you touch or have contact with the patients blood, body fluids, and/or secretions or excretions, such as sweat, saliva, sputum, nasal mucus, vomit, urine, or feces.  Ensure the mask fits over your nose and mouth tightly, and do not touch it during use.  Throw out disposable facemasks and gloves after using them. Do not reuse.  Wash your hands immediately after removing your facemask and gloves.  If your personal clothing becomes contaminated, carefully remove clothing and launder. Wash your hands after handling contaminated clothing.  Place all used disposable facemasks, gloves, and other waste in a lined  container before disposing them with other household waste.  Remove gloves and wash your hands immediately after handling these items.  Do not share dishes, glasses, or other household items with the patient  Avoid sharing household items. You should not share dishes, drinking glasses, cups, eating utensils, towels, bedding, or other items with a patient who is confirmed to have, or being evaluated for, COVID-19 infection.  After the person uses these items, you should wash them thoroughly with soap and water.  Wash laundry thoroughly  Immediately remove and wash clothes or bedding that have blood, body fluids, and/or secretions or excretions, such as sweat, saliva, sputum, nasal mucus, vomit, urine, or feces, on them.  Wear gloves when handling laundry from the patient.  Read and follow directions on labels of laundry or clothing items and detergent. In general, wash and dry with the warmest temperatures recommended on the label.  Clean all areas the individual has used often  Clean all touchable surfaces, such as counters, tabletops, doorknobs, bathroom fixtures, toilets, phones, keyboards, tablets, and bedside tables, every day. Also, clean any surfaces that may have blood, body fluids, and/or secretions or excretions on them.  Wear gloves when cleaning surfaces the patient has come in contact with.  Use a diluted bleach solution (e.g., dilute bleach with 1  part bleach and 10 parts water) or a household disinfectant with a label that says EPA-registered for coronaviruses. To make a bleach solution at home, add 1 tablespoon of bleach to 1 quart (4 cups) of water. For a larger supply, add  cup of bleach to 1 gallon (16 cups) of water.  Read labels of cleaning products and follow recommendations provided on product labels. Labels contain instructions for safe and effective use of the cleaning product including precautions you should take when applying the product, such as wearing gloves or  eye protection and making sure you have good ventilation during use of the product.  Remove gloves and wash hands immediately after cleaning.  Monitor yourself for signs and symptoms of illness Caregivers and household members are considered close contacts, should monitor their health, and will be asked to limit movement outside of the home to the extent possible. Follow the monitoring steps for close contacts listed on the symptom monitoring form.   ? If you have additional questions, contact your local health department or call the epidemiologist on call at [phone] (available 24/7). ? This guidance is subject to change. For the most up-to-date guidance from [facility], please refer to their website: YouBlogs.pl

## 2019-11-29 ENCOUNTER — Telehealth: Payer: Self-pay | Admitting: Nurse Practitioner

## 2019-11-29 DIAGNOSIS — G4726 Circadian rhythm sleep disorder, shift work type: Secondary | ICD-10-CM

## 2019-11-29 DIAGNOSIS — Z9989 Dependence on other enabling machines and devices: Secondary | ICD-10-CM

## 2019-11-29 DIAGNOSIS — U071 COVID-19: Secondary | ICD-10-CM

## 2019-11-29 DIAGNOSIS — N179 Acute kidney failure, unspecified: Secondary | ICD-10-CM

## 2019-11-29 DIAGNOSIS — G4733 Obstructive sleep apnea (adult) (pediatric): Secondary | ICD-10-CM

## 2019-11-29 DIAGNOSIS — J9600 Acute respiratory failure, unspecified whether with hypoxia or hypercapnia: Secondary | ICD-10-CM

## 2019-11-29 NOTE — Telephone Encounter (Signed)
Have seen many patients unable to tolerate their previously well tolerated CPAP pressures after COVID.  We can order auto titration pressures , opening his device for  Lower pressures- most patients report the pressures feel too strong- in some , we needed to change to BiPAP.   I will order DME to open his pressure settings- if his device does not allow such step, may need to order a rental device for up to 3 month to continue treatment.

## 2019-11-29 NOTE — Telephone Encounter (Addendum)
[name], Adam(wife on DPR) has called to inform that pt has Covid-19 and the pressure settings need adjusting, they are too high. This message is being sent to On Call Dr  please call

## 2019-11-29 NOTE — Telephone Encounter (Signed)
I returned patient's call to the office.  He stated he just got out of the hospital with Covid and is having trouble tolerating his CPAP at the current pressure settings.  He was unable to tell me what pressure it is in and asked him to reduce the setting he was unable to do so.  I asked him not to wear it and to call either the sleep lab tonight or the office on Monday to get guidance to lower his CPAP pressure settings.

## 2019-11-29 NOTE — Addendum Note (Signed)
Addended by: Larey Seat on: 11/29/2019 11:57 AM   Modules accepted: Orders

## 2019-12-02 ENCOUNTER — Other Ambulatory Visit: Payer: Self-pay | Admitting: Neurology

## 2019-12-02 DIAGNOSIS — G4733 Obstructive sleep apnea (adult) (pediatric): Secondary | ICD-10-CM

## 2019-12-02 DIAGNOSIS — Z9989 Dependence on other enabling machines and devices: Secondary | ICD-10-CM

## 2019-12-02 NOTE — Telephone Encounter (Signed)
Called the patient to discuss. He states that because he has difficulty with breathing in, when he starts that machine it is ok but as it ramps up its pushing air in harder then he can breathe in. States its a feeling of too much pressure. He is currently on a auto CPAP 5-15 cm water pressure with 3 cm EPR. Prior to covid he would hang around the 11-12 cm water pressure and he tolerated it with no problems. Advised the patient that I will discuss and get the settings Dr Brett Fairy would like to reduce the pressure to and then I will send the new orders to [place]. Advised [facility] should make the change either today or tomorrow and inform him of the change. Pt verbalized understanding and was appreciative of the return call.

## 2019-12-02 NOTE — Telephone Encounter (Signed)
Spoke with Dr Brett Fairy and reviewed the download with her for the patient. Dr Brett Fairy has ordered to bring maximum pressure down to 9 cm, making auto window 5-9 cm water pressure to see if patient able to tolerate that. Order sent to [facility]

## 2019-12-09 ENCOUNTER — Telehealth: Payer: Self-pay | Admitting: Family Medicine

## 2019-12-09 NOTE — Telephone Encounter (Signed)
Does he need a chest xray prior to apt?

## 2019-12-09 NOTE — Telephone Encounter (Addendum)
Patients wife calling with questions regarding his fenofibrate please call her back at [phone] Also wants to ask about adding zyrtek, and if he should get a chest xray before his appointment with dr Dennard Schaumann

## 2019-12-10 ENCOUNTER — Encounter: Payer: Self-pay | Admitting: Family Medicine

## 2019-12-10 NOTE — Telephone Encounter (Signed)
LMOVM stating no cxr needed prior to appt. Can wait until apt to ask questions about meds or can call me back.

## 2019-12-10 NOTE — Telephone Encounter (Signed)
I would like to see him first

## 2019-12-11 ENCOUNTER — Other Ambulatory Visit: Payer: Self-pay

## 2019-12-13 ENCOUNTER — Encounter: Payer: Self-pay | Admitting: Family Medicine

## 2019-12-13 ENCOUNTER — Ambulatory Visit (INDEPENDENT_AMBULATORY_CARE_PROVIDER_SITE_OTHER): Payer: BC Managed Care – PPO | Admitting: Family Medicine

## 2019-12-13 ENCOUNTER — Other Ambulatory Visit: Payer: Self-pay

## 2019-12-13 VITALS — BP 126/74 | HR 84 | Temp 97.8°F | Resp 18 | Ht 73.0 in | Wt 280.0 lb

## 2019-12-13 DIAGNOSIS — J9600 Acute respiratory failure, unspecified whether with hypoxia or hypercapnia: Secondary | ICD-10-CM

## 2019-12-13 DIAGNOSIS — N179 Acute kidney failure, unspecified: Secondary | ICD-10-CM

## 2019-12-13 DIAGNOSIS — U071 COVID-19: Secondary | ICD-10-CM

## 2019-12-13 LAB — CBC WITH DIFFERENTIAL/PLATELET
Absolute Monocytes: 763 cells/uL (ref 200–950)
Basophils Absolute: 62 cells/uL (ref 0–200)
Basophils Relative: 1 %
Eosinophils Absolute: 298 cells/uL (ref 15–500)
Eosinophils Relative: 4.8 %
HCT: 44.1 % (ref 38.5–50.0)
Hemoglobin: 15.1 g/dL (ref 13.2–17.1)
Lymphs Abs: 1525 cells/uL (ref 850–3900)
MCH: 30 pg (ref 27.0–33.0)
MCHC: 34.2 g/dL (ref 32.0–36.0)
MCV: 87.5 fL (ref 80.0–100.0)
MPV: 11 fL (ref 7.5–12.5)
Monocytes Relative: 12.3 %
Neutro Abs: 3553 cells/uL (ref 1500–7800)
Neutrophils Relative %: 57.3 %
Platelets: 150 10*3/uL (ref 140–400)
RBC: 5.04 10*6/uL (ref 4.20–5.80)
RDW: 14 % (ref 11.0–15.0)
Total Lymphocyte: 24.6 %
WBC: 6.2 10*3/uL (ref 3.8–10.8)

## 2019-12-13 LAB — COMPLETE METABOLIC PANEL WITH GFR
AG Ratio: 1.7 (calc) (ref 1.0–2.5)
ALT: 20 U/L (ref 9–46)
AST: 16 U/L (ref 10–35)
Albumin: 3.7 g/dL (ref 3.6–5.1)
Alkaline phosphatase (APISO): 60 U/L (ref 35–144)
BUN: 10 mg/dL (ref 7–25)
CO2: 25 mmol/L (ref 20–32)
Calcium: 9 mg/dL (ref 8.6–10.3)
Chloride: 105 mmol/L (ref 98–110)
Creat: 0.88 mg/dL (ref 0.70–1.33)
GFR, Est African American: 114 mL/min/{1.73_m2} (ref >=60)
GFR, Est Non African American: 98 mL/min/{1.73_m2} (ref >=60)
Globulin: 2.2 g/dL (calc) (ref 1.9–3.7)
Glucose, Bld: 92 mg/dL (ref 65–99)
Potassium: 4 mmol/L (ref 3.5–5.3)
Sodium: 140 mmol/L (ref 135–146)
Total Bilirubin: 0.5 mg/dL (ref 0.2–1.2)
Total Protein: 5.9 g/dL — ABNORMAL LOW (ref 6.1–8.1)

## 2019-12-13 NOTE — Progress Notes (Signed)
Subjective:    Patient ID: Adam [name], male    DOB: [birth year], 54 y.o.   MRN: [id number]  HPI  Unfortunately, patient was recently hospitalized.  I have copied relevant portions of his discharge summary and included them below for my reference:  Admission date:  11/18/2019  Admitting Physician  Rise Patience, MD  Discharge Date:  11/28/2019   Primary MD  Susy Frizzle, MD  Recommendations for primary care physician for things to follow:  - please Check CBC, CMP during next visit    Admission Diagnosis  Hypoxia [R09.02] Community acquired pneumonia, unspecified laterality [J18.9] Acute respiratory failure due to COVID-19 ([place]) [U07.1, J96.00] COVID-19 [U07.1]   Discharge Diagnosis  Hypoxia [R09.02] Community acquired pneumonia, unspecified laterality [J18.9] Acute respiratory failure due to COVID-19 ([place]) [U07.1, J96.00] COVID-19 [U07.1]    Principal Problem:   Acute respiratory failure due to COVID-19 [facility]) Active Problems:   OSA (obstructive sleep apnea)   ARF (acute renal failure) ([place])          Past Medical History:  Diagnosis Date  . GERD (gastroesophageal reflux disease)   . 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 OSA (obstructive sleep apnea) 09/06/2016   AHI-30, CPAP         Past Surgical History:  Procedure Laterality Date  . DG 4TH DIGIT RIGHT HAND    . HERNIA REPAIR    . TONSILLECTOMY    . WISDOM TOOTH EXTRACTION         History of present illness and  Hospital Course:     Kindly see H&P for history of present illness and admission details, please review complete Labs, Consult reports and Test reports for all details in brief  HPI  from the history and physical done on the day of admission 11/17/2025   [id number] Smithis a 54 y.o.malewithhistory of hyperlipidemia sleep apnea presented to the ER with complaint of shortness of breath. Patient states he started having severe  diarrhea over week ago. Over the last 2 days patient has been clinically increasing short of breath with nonproductive cough and decided to come to the ER. Denies chest pain. Patient states many people in his workplace has been diagnosed with COVID-19 infection.  ED Course:In the ER patient is requiring 4 L oxygen with temperature of 101.5 chest x-ray shows right-sided infiltrates and labs show creatinine of 1.4 BNP 70.3 high-sensitivity troponin was negative CRP 34.8 procalcitonin 0.46 WBC count 13. D-dimer was 0.87 EKG shows right bundle branch block. Initially patient was started on empiric antibiotics for community-acquired pneumonia later patient's Covid test came back positive and patient is started on IV remdesivir Decadron and patient gave consent for starting Actemra after discussing with patient about the off label use of Actemra for COVID-19 infection also its side effects and contraindications.  Hospital Course   Adam Smith26 year old with a history of HLD and OSA who presented to [facility] 1/5 with a week of diarrhea and subsequent cough with dyspnea found to be hypoxic , febrile with bilateral infiltrates on CXR and positive SARS-CoV-2 testing. He was admitted to [facility] and received a dose of tocilizumab due to markedly elevated inflammatory markers (CRP 34.8). Due to right midlung zone infiltrate, ceftriaxone and azithromycin were added, remdesivir and steroids continued. Inflammatory markers have improved significantly though hypoxemia has been more stubborn. Having completed full courses of antibiotics and antiviral therapy, oxygen requirement appears to be improving.  Acute hypoxic respiratory failure due to covid-19  pneumonia, superimposed RML bacterial CAP: BNP wnl. Tn normal. Positive SARS-CoV-2 PCR 1/4. Note somewhat delayed presentation as patient has been short of breath, taking  CPAP to breathe while sleeping for days. - Received tocilizumab 1/5.  - Completed remdesivir 1/5 -  1/9 -He was treated with Decadron, still having some oxygen requirement, so we will give another 5 days on discharge - PCT normalized, CRP significantly decreased. -Patient was encouraged to use incentive spirometry, flutter valve, when he goes home, and instructed to stay active and ambulatory. -Patient still requiring 2 L nasal cannula, maintain activity on discharge, home oxygen will be arranged.  Elevated D-dimers -Dimers elevated, peaked at 3.1, venous Dopplers negative for DVT, CTA chest negative for PE, trending down the day of discharge, it is 2.6 today, instructed him to take baby aspirin for total of 14 days.  Insomnia:    Anxiety:   AKI: Improved. .  Obesity: BMI 38. Noted.   HLD:  - Stopped statin with mild rise in LFTs. Can likely restart soon.  LFT elevation: Secondary to COVID-19 infection, improving  Covid-19 gastroenteritis:  - Symptomatic management, resolved.  Patient is here today for follow-up.  Patient is still extremely weak.  He has lost more than 25 pounds since Christmas.  He was confined to the hospital lying in bed for 10 days.  He has significant muscular deconditioning.  The patient has a mild tremor with activity due to the deconditioning and weakness.  He is also lost significant muscle mass in his hamstrings on both sides.  He is performing daily exercises to try to build up his strength.  He has been able to wean himself away from the oxygen and is now 93% on room air however whenever he takes a shower he has to wear the oxygen due to the humidity and heat which causes him difficulty breathing.  He denies any chest pain.  He denies any cough.  He denies any hemoptysis.  He denies any pleurisy however he becomes extremely fatigued and winded with minimal activity.  He denies any nausea or vomiting.  He denies any diarrhea.  He is trying to drink plenty of fluids.  He reports normal urine output with no hematuria or dysuria.  There is no leg swelling  on exam today to suggest a DVT.  He is still taking an aspirin as recommended at the hospital due to his elevated D-dimer.  He is also taking pantoprazole due to the aspirin.  Past Medical History:  Diagnosis Date  . GERD (gastroesophageal reflux disease)   . 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 OSA (obstructive sleep apnea) 09/06/2016   AHI-30, CPAP   Past Surgical History:  Procedure Laterality Date  . DG 4TH DIGIT RIGHT HAND    . HERNIA REPAIR    . TONSILLECTOMY    . WISDOM TOOTH EXTRACTION     Current Outpatient Medications on File Prior to Visit  Medication Sig Dispense Refill  . aspirin EC 81 MG tablet Take 1 tablet (81 mg total) by mouth daily for 15 days. 15 tablet 0  . pantoprazole (PROTONIX) 40 MG tablet Take 1 tablet (40 mg total) by mouth daily. 30 tablet 0  . acetaminophen (TYLENOL) 325 MG tablet Take 2 tablets (650 mg total) by mouth every 6 (six) hours as needed for mild pain (or Fever >/= 101). (Patient not taking: Reported on 12/13/2019)    . Ascorbic Acid (VITAMIN C) 1000 MG tablet Take  1,000 mg by mouth 2 (two) times daily.     . Cholecalciferol (VITAMIN D) 2000 units CAPS Take by mouth.    . fenofibrate 160 MG tablet TAKE 1 TABLET BY MOUTH EVERY DAY (Patient not taking: Reported on 12/13/2019) 90 tablet 1  . OVER THE COUNTER MEDICATION Take 1 tablet by mouth daily. Multi Vitamin - [employer] Brand     No current facility-administered medications on file prior to visit.   No Known Allergies Social History   Socioeconomic History  . Marital status: Married    Spouse name: Not on file  . Number of children: Not on file  . Years of education: Not on file  . Highest education level: Not on file  Occupational History  . Not on file  Tobacco Use  . Smoking status: Never Smoker  . Smokeless tobacco: Never Used  Substance and Sexual Activity  . Alcohol use: Yes    Alcohol/week: 0.0 standard drinks    Comment: Very seldom - beer  . Drug use: No    . Sexual activity: Not on file  Other Topics Concern  . Not on file  Social History Narrative  . Not on file   Social Determinants of Health   Financial Resource Strain:   . Difficulty of Paying Living Expenses: Not on file  Food Insecurity:   . Worried About Charity fundraiser in the Last Year: Not on file  . Ran Out of Food in the Last Year: Not on file  Transportation Needs:   . Lack of Transportation (Medical): Not on file  . Lack of Transportation (Non-Medical): Not on file  Physical Activity:   . Days of Exercise per Week: Not on file  . Minutes of Exercise per Session: Not on file  Stress:   . Feeling of Stress : Not on file  Social Connections:   . Frequency of Communication with Friends and Family: Not on file  . Frequency of Social Gatherings with Friends and Family: Not on file  . Attends Religious Services: Not on file  . Active Member of Clubs or Organizations: Not on file  . Attends Archivist Meetings: Not on file  . Marital Status: Not on file  Intimate Partner Violence:   . Fear of Current or Ex-Partner: Not on file  . Emotionally Abused: Not on file  . Physically Abused: Not on file  . Sexually Abused: Not on file    Review of Systems     Objective:   Physical Exam Vitals reviewed.  Constitutional:      General: He is not in acute distress.    Appearance: Normal appearance. He is not ill-appearing, toxic-appearing or diaphoretic.  Cardiovascular:     Rate and Rhythm: Normal rate and regular rhythm.     Heart sounds: Normal heart sounds. No murmur. No friction rub. No gallop.   Pulmonary:     Effort: Pulmonary effort is normal. No respiratory distress.     Breath sounds: Decreased air movement present. Decreased breath sounds present. No wheezing, rhonchi or rales.  Musculoskeletal:     Right lower leg: No edema.     Left lower leg: No edema.  Neurological:     Mental Status: He is alert.           Assessment & Plan:   COVID-19 virus infection - Plan: CBC with Differential/Platelet, COMPLETE METABOLIC PANEL WITH GFR  Acute respiratory failure due to COVID-19 ([place]) - Plan: CBC with Differential/Platelet, COMPLETE METABOLIC PANEL  WITH GFR  Acute renal failure, unspecified acute renal failure type ([place]) - Plan: CBC with Differential/Platelet, COMPLETE METABOLIC PANEL WITH GFR  Patient is slowly recovering.  He is no longer hypoxic at rest although he still requires oxygen with bathing and activities of daily living.  He still has diminished breath sounds and decreased air movement suggesting decrease lung capacity.  This will gradually improve with time hopefully.  However he requires additional time for convalescence.  At the present time he has significant dyspnea on exertion that prevents him from working.  He also has muscular deconditioning that prevents him from working.  Therefore I have recommended 4 weeks of total disability.  I will reassess the patient on a weekly basis in case he recovers more quickly than anticipated.  I will see the patient back next week and reassess.  We will check a CBC today to monitor for any bone marrow suppression due to the virus or any evidence of anemia.  I feel that he is far enough out the window now that he can stop aspirin for DVT prophylaxis and because he stopping aspirin he can also stop pantoprazole.  I would only resume his omeprazole if he starts to take his meloxicam daily again.  He does have an rash consistent with Candida intertrigo underneath his abdominal pannus as well as in the space between his scrotum and thigh on the left side.  We will treat this with Lotrimin cream twice daily for 2 weeks.  We also discussed hygiene measures to help prevent this from reoccurring.  I will monitor his renal function and liver function test with a CMP.  I recommended that we hold off on resuming his fenofibrate until his liver function test have improved and his muscular  deconditioning has improved.  Reassess the patient in 1 week.

## 2019-12-20 ENCOUNTER — Ambulatory Visit (INDEPENDENT_AMBULATORY_CARE_PROVIDER_SITE_OTHER): Payer: BC Managed Care – PPO | Admitting: Family Medicine

## 2019-12-20 ENCOUNTER — Other Ambulatory Visit: Payer: Self-pay

## 2019-12-20 ENCOUNTER — Encounter: Payer: Self-pay | Admitting: Family Medicine

## 2019-12-20 VITALS — BP 130/74 | HR 88 | Temp 97.1°F | Resp 14 | Ht 73.0 in | Wt 280.0 lb

## 2019-12-20 DIAGNOSIS — U071 COVID-19: Secondary | ICD-10-CM | POA: Diagnosis not present

## 2019-12-20 NOTE — Progress Notes (Signed)
Subjective:    Patient ID: Adam [name], male    DOB: 1966/04/27, 54 y.o.   MRN: [id number]  HPI  Unfortunately, patient was recently hospitalized.  I have copied relevant portions of his discharge summary and included them below for my reference:  Admission date:  11/18/2019  Admitting Physician  Rise Patience, MD  Discharge Date:  11/28/2019   Primary MD  Susy Frizzle, MD  Recommendations for primary care physician for things to follow:  - please Check CBC, CMP during next visit    Admission Diagnosis  Hypoxia [R09.02] Community acquired pneumonia, unspecified laterality [J18.9] Acute respiratory failure due to COVID-19 ([place]) [U07.1, J96.00] COVID-19 [U07.1]   Discharge Diagnosis  Hypoxia [R09.02] Community acquired pneumonia, unspecified laterality [J18.9] Acute respiratory failure due to COVID-19 ([place]) [U07.1, J96.00] COVID-19 [U07.1]    Principal Problem:   Acute respiratory failure due to COVID-19 [facility]) Active Problems:   OSA (obstructive sleep apnea)   ARF (acute renal failure) ([place])          Past Medical History:  Diagnosis Date   GERD (gastroesophageal reflux disease)    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)   OSA (obstructive sleep apnea) 09/06/2016   AHI-30, CPAP         Past Surgical History:  Procedure Laterality Date   DG 4TH DIGIT RIGHT HAND     HERNIA REPAIR     TONSILLECTOMY     WISDOM TOOTH EXTRACTION         History of present illness and  Hospital Course:     Kindly see H&P for history of present illness and admission details, please review complete Labs, Consult reports and Test reports for all details in brief  HPI  from the history and physical done on the day of admission 11/17/2025   [id number] Adam [name] a 54 y.o.malewithhistory of hyperlipidemia sleep apnea presented to the ER with complaint of shortness of breath. Patient states he started having severe  diarrhea over week ago. Over the last 2 days patient has been clinically increasing short of breath with nonproductive cough and decided to come to the ER. Denies chest pain. Patient states many people in his workplace has been diagnosed with COVID-19 infection.  ED Course:In the ER patient is requiring 4 L oxygen with temperature of 101.5 chest x-ray shows right-sided infiltrates and labs show creatinine of 1.4 BNP 70.3 high-sensitivity troponin was negative CRP 34.8 procalcitonin 0.46 WBC count 13. D-dimer was 0.87 EKG shows right bundle branch block. Initially patient was started on empiric antibiotics for community-acquired pneumonia later patient's Covid test came back positive and patient is started on IV remdesivir Decadron and patient gave consent for starting Actemra after discussing with patient about the off label use of Actemra for COVID-19 infection also its side effects and contraindications.  Hospital Course   Adam [name] year old with a history of HLD and OSA who presented to [facility] 1/5 with a week of diarrhea and subsequent cough with dyspnea found to be hypoxic , febrile with bilateral infiltrates on CXR and positive SARS-CoV-2 testing. He was admitted to [facility] and received a dose of tocilizumab due to markedly elevated inflammatory markers (CRP 34.8). Due to right midlung zone infiltrate, ceftriaxone and azithromycin were added, remdesivir and steroids continued. Inflammatory markers have improved significantly though hypoxemia has been more stubborn. Having completed full courses of antibiotics and antiviral therapy, oxygen requirement appears to be improving.  Acute hypoxic respiratory failure due to covid-19  pneumonia, superimposed RML bacterial CAP: BNP wnl. Tn normal. Positive SARS-CoV-2 PCR 1/4. Note somewhat delayed presentation as patient has been short of breath, taking  CPAP to breathe while sleeping for days. - Received tocilizumab 1/5.  - Completed remdesivir 1/5 -  1/9 -He was treated with Decadron, still having some oxygen requirement, so we will give another 5 days on discharge - PCT normalized, CRP significantly decreased. -Patient was encouraged to use incentive spirometry, flutter valve, when he goes home, and instructed to stay active and ambulatory. -Patient still requiring 2 L nasal cannula, maintain activity on discharge, home oxygen will be arranged.  Elevated D-dimers -Dimers elevated, peaked at 3.1, venous Dopplers negative for DVT, CTA chest negative for PE, trending down the day of discharge, it is 2.6 today, instructed him to take baby aspirin for total of 14 days.  Insomnia:    Anxiety:   AKI: Improved. .  Obesity: BMI 38. Noted.   HLD:  - Stopped statin with mild rise in LFTs. Can likely restart soon.  LFT elevation: Secondary to COVID-19 infection, improving  Covid-19 gastroenteritis:  - Symptomatic management, resolved.  Patient is here today for follow-up.  Patient is still extremely weak.  He has lost more than 25 pounds since Christmas.  He was confined to the hospital lying in bed for 10 days.  He has significant muscular deconditioning.  The patient has a mild tremor with activity due to the deconditioning and weakness.  He is also lost significant muscle mass in his hamstrings on both sides.  He is performing daily exercises to try to build up his strength.  He has been able to wean himself away from the oxygen and is now 93% on room air however whenever he takes a shower he has to wear the oxygen due to the humidity and heat which causes him difficulty breathing.  He denies any chest pain.  He denies any cough.  He denies any hemoptysis.  He denies any pleurisy however he becomes extremely fatigued and winded with minimal activity.  He denies any nausea or vomiting.  He denies any diarrhea.  He is trying to drink plenty of fluids.  He reports normal urine output with no hematuria or dysuria.  There is no leg swelling  on exam today to suggest a DVT.  He is still taking an aspirin as recommended at the hospital due to his elevated D-dimer.  He is also taking pantoprazole due to the aspirin.  At that time, my plan was: Patient is slowly recovering.  He is no longer hypoxic at rest although he still requires oxygen with bathing and activities of daily living.  He still has diminished breath sounds and decreased air movement suggesting decrease lung capacity.  This will gradually improve with time hopefully.  However he requires additional time for convalescence.  At the present time he has significant dyspnea on exertion that prevents him from working.  He also has muscular deconditioning that prevents him from working.  Therefore I have recommended 4 weeks of total disability.  I will reassess the patient on a weekly basis in case he recovers more quickly than anticipated.  I will see the patient back next week and reassess.  We will check a CBC today to monitor for any bone marrow suppression due to the virus or any evidence of anemia.  I feel that he is far enough out the window now that he can stop aspirin for DVT prophylaxis and because he stopping aspirin he  can also stop pantoprazole.  I would only resume his omeprazole if he starts to take his meloxicam daily again.  He does have an rash consistent with Candida intertrigo underneath his abdominal pannus as well as in the space between his scrotum and thigh on the left side.  We will treat this with Lotrimin cream twice daily for 2 weeks.  We also discussed hygiene measures to help prevent this from reoccurring.  I will monitor his renal function and liver function test with a CMP.  I recommended that we hold off on resuming his fenofibrate until his liver function test have improved and his muscular deconditioning has improved.  Reassess the patient in 1 week.  12/20/19 Patient has been off oxygen now for the last 3 days per his report.  He is able to perform activities of  daily living such as bathing, toileting, etc. without wearing oxygen.  He states that his oxygen levels at home have been between 93 and 95% off oxygen.  He denies any chest pain.  He denies any shortness of breath at rest.  He does have fatigue and deconditioning however this is improving.  On exam today, the patient's breath sounds have improved dramatically.  He is moving better air and there is no expiratory wheezing. Wt Readings from Last 3 Encounters:  12/20/19 280 lb (127 kg)  12/13/19 280 lb (127 kg)  11/18/19 290 lb (131.5 kg)   Patient's weight is stable from his last visit.  He states that he is tolerating food without any difficulty.  He denies any nausea or vomiting or diarrhea.  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      Cholecalciferol (VITAMIN D) 2000 units CAPS Take by mouth.     omeprazole (PRILOSEC) 40 MG capsule Take 40 mg by mouth daily.     OVER THE COUNTER MEDICATION Take 1 tablet by mouth daily. Multi Vitamin - [employer] Brand     acetaminophen (TYLENOL) 325 MG tablet Take 2 tablets (650 mg total) by mouth every 6 (six) hours as needed for mild pain (or Fever >/= 101). (Patient not taking: Reported on 12/13/2019)     fenofibrate 160 MG tablet TAKE 1 TABLET BY MOUTH EVERY DAY (Patient not taking: Reported on 12/13/2019) 90 tablet 1   No current facility-administered medications on file prior to visit.   No Known Allergies Social History   Socioeconomic History   Marital status:  Married    Spouse name: Not on file   Number of children: Not on file   Years of education: Not on file   Highest education level: Not on file  Occupational History   Not on file  Tobacco Use   Smoking status: Never Smoker   Smokeless tobacco: Never Used  Substance and Sexual Activity   Alcohol use: Yes    Alcohol/week: 0.0 standard drinks    Comment: Very seldom - beer  Drug use: No   Sexual activity: Not on file  Other Topics Concern   Not on file  Social History Narrative   Not on file   Social Determinants of Health   Financial Resource Strain:    Difficulty of Paying Living Expenses: Not on file  Food Insecurity:    Worried About [place] in the Last Year: Not on file   Ran Out of Food in the Last Year: Not on file  Transportation Needs:    Lack of Transportation (Medical): Not on file   Lack of Transportation (Non-Medical): Not on file  Physical Activity:    Days of Exercise per Week: Not on file   Minutes of Exercise per Session: Not on file  Stress:    Feeling of Stress : Not on file  Social Connections:    Frequency of Communication with Friends and Family: Not on file   Frequency of Social Gatherings with Friends and Family: Not on file   Attends Religious Services: Not on file   Active Member of Clubs or Organizations: Not on file   Attends Archivist Meetings: Not on file   Marital Status: Not on file  Intimate Partner Violence:    Fear of Current or Ex-Partner: Not on file   Emotionally Abused: Not on file   Physically Abused: Not on file   Sexually Abused: Not on file    Review of Systems     Objective:   Physical Exam Vitals reviewed.  Constitutional:      General: He is not in acute distress.    Appearance: Normal appearance. He is not ill-appearing, toxic-appearing or diaphoretic.  Cardiovascular:     Rate and Rhythm: Normal rate and regular rhythm.     Heart sounds: Normal heart sounds.  No murmur. No friction rub. No gallop.   Pulmonary:     Effort: Pulmonary effort is normal. No respiratory distress.     Breath sounds: No decreased air movement. No decreased breath sounds, wheezing, rhonchi or rales.  Musculoskeletal:     Right lower leg: No edema.     Left lower leg: No edema.  Neurological:     Mental Status: He is alert.           Assessment & Plan:  COVID-19 virus infection  Patient is doing much better.  I recommended that he refrain from working 1 additional week.  I believe at that point he can return to work via working from home.  He will resume working from home in 1 week.  I would like the patient to work from home for 2 weeks at that point to avoid possibly acquiring a secondary infection from going back to work during flu season which may cause the patient to deteriorate rapidly.  If the patient is doing well he can resume work with a normal schedule in March.

## 2019-12-26 ENCOUNTER — Encounter: Payer: Self-pay | Admitting: Family Medicine

## 2019-12-29 DIAGNOSIS — U071 COVID-19: Secondary | ICD-10-CM | POA: Diagnosis not present

## 2020-01-03 ENCOUNTER — Other Ambulatory Visit: Payer: Self-pay

## 2020-01-03 ENCOUNTER — Ambulatory Visit (INDEPENDENT_AMBULATORY_CARE_PROVIDER_SITE_OTHER): Payer: BC Managed Care – PPO | Admitting: Family Medicine

## 2020-01-03 ENCOUNTER — Encounter: Payer: Self-pay | Admitting: Family Medicine

## 2020-01-03 VITALS — BP 122/74 | HR 70 | Temp 96.9°F | Resp 18 | Ht 73.0 in | Wt 290.0 lb

## 2020-01-03 DIAGNOSIS — U071 COVID-19: Secondary | ICD-10-CM | POA: Diagnosis not present

## 2020-01-03 DIAGNOSIS — E781 Pure hyperglyceridemia: Secondary | ICD-10-CM | POA: Diagnosis not present

## 2020-01-03 NOTE — Progress Notes (Signed)
Subjective:    Patient ID: Adam [name], male    DOB: [birth year], 54 y.o.   MRN: [id number]  HPI  Unfortunately, patient was recently hospitalized.  I have copied relevant portions of his discharge summary and included them below for my reference:  Admission date:  11/18/2019  Admitting Physician  Rise Patience, MD  Discharge Date:  11/28/2019   Primary MD  Susy Frizzle, MD  Recommendations for primary care physician for things to follow:  - please Check CBC, CMP during next visit    Admission Diagnosis  Hypoxia [R09.02] Community acquired pneumonia, unspecified laterality [J18.9] Acute respiratory failure due to COVID-19 ([place]) [U07.1, J96.00] COVID-19 [U07.1]   Discharge Diagnosis  Hypoxia [R09.02] Community acquired pneumonia, unspecified laterality [J18.9] Acute respiratory failure due to COVID-19 ([place]) [U07.1, J96.00] COVID-19 [U07.1]    Principal Problem:   Acute respiratory failure due to COVID-19 [facility]) Active Problems:   OSA (obstructive sleep apnea)   ARF (acute renal failure) ([place])          Past Medical History:  Diagnosis Date  . GERD (gastroesophageal reflux disease)   . 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 OSA (obstructive sleep apnea) 09/06/2016   AHI-30, CPAP         Past Surgical History:  Procedure Laterality Date  . DG 4TH DIGIT RIGHT HAND    . HERNIA REPAIR    . TONSILLECTOMY    . WISDOM TOOTH EXTRACTION         History of present illness and  Hospital Course:     Kindly see H&P for history of present illness and admission details, please review complete Labs, Consult reports and Test reports for all details in brief  HPI  from the history and physical done on the day of admission 11/17/2025   [id number] Adam [name] a 54 y.o.malewithhistory of hyperlipidemia sleep apnea presented to the ER with complaint of shortness of breath. Patient states he started having severe  diarrhea over week ago. Over the last 2 days patient has been clinically increasing short of breath with nonproductive cough and decided to come to the ER. Denies chest pain. Patient states many people in his workplace has been diagnosed with COVID-19 infection.  ED Course:In the ER patient is requiring 4 L oxygen with temperature of 101.5 chest x-ray shows right-sided infiltrates and labs show creatinine of 1.4 BNP 70.3 high-sensitivity troponin was negative CRP 34.8 procalcitonin 0.46 WBC count 13. D-dimer was 0.87 EKG shows right bundle branch block. Initially patient was started on empiric antibiotics for community-acquired pneumonia later patient's Covid test came back positive and patient is started on IV remdesivir Decadron and patient gave consent for starting Actemra after discussing with patient about the off label use of Actemra for COVID-19 infection also its side effects and contraindications.  Hospital Course   Adam Smith82 year old with a history of HLD and OSA who presented to [facility] 1/5 with a week of diarrhea and subsequent cough with dyspnea found to be hypoxic , febrile with bilateral infiltrates on CXR and positive SARS-CoV-2 testing. He was admitted to [facility] and received a dose of tocilizumab due to markedly elevated inflammatory markers (CRP 34.8). Due to right midlung zone infiltrate, ceftriaxone and azithromycin were added, remdesivir and steroids continued. Inflammatory markers have improved significantly though hypoxemia has been more stubborn. Having completed full courses of antibiotics and antiviral therapy, oxygen requirement appears to be improving.  Acute hypoxic respiratory failure due to covid-19  pneumonia, superimposed RML bacterial CAP: BNP wnl. Tn normal. Positive SARS-CoV-2 PCR 1/4. Note somewhat delayed presentation as patient has been short of breath, taking  CPAP to breathe while sleeping for days. - Received tocilizumab 1/5.  - Completed remdesivir 1/5 -  1/9 -He was treated with Decadron, still having some oxygen requirement, so we will give another 5 days on discharge - PCT normalized, CRP significantly decreased. -Patient was encouraged to use incentive spirometry, flutter valve, when he goes home, and instructed to stay active and ambulatory. -Patient still requiring 2 L nasal cannula, maintain activity on discharge, home oxygen will be arranged.  Elevated D-dimers -Dimers elevated, peaked at 3.1, venous Dopplers negative for DVT, CTA chest negative for PE, trending down the day of discharge, it is 2.6 today, instructed him to take baby aspirin for total of 14 days.  Insomnia:    Anxiety:   AKI: Improved. .  Obesity: BMI 38. Noted.   HLD:  - Stopped statin with mild rise in LFTs. Can likely restart soon.  LFT elevation: Secondary to COVID-19 infection, improving  Covid-19 gastroenteritis:  - Symptomatic management, resolved.  Patient is here today for follow-up.  Patient is still extremely weak.  He has lost more than 25 pounds since Christmas.  He was confined to the hospital lying in bed for 10 days.  He has significant muscular deconditioning.  The patient has a mild tremor with activity due to the deconditioning and weakness.  He is also lost significant muscle mass in his hamstrings on both sides.  He is performing daily exercises to try to build up his strength.  He has been able to wean himself away from the oxygen and is now 93% on room air however whenever he takes a shower he has to wear the oxygen due to the humidity and heat which causes him difficulty breathing.  He denies any chest pain.  He denies any cough.  He denies any hemoptysis.  He denies any pleurisy however he becomes extremely fatigued and winded with minimal activity.  He denies any nausea or vomiting.  He denies any diarrhea.  He is trying to drink plenty of fluids.  He reports normal urine output with no hematuria or dysuria.  There is no leg swelling  on exam today to suggest a DVT.  He is still taking an aspirin as recommended at the hospital due to his elevated D-dimer.  He is also taking pantoprazole due to the aspirin.  At that time, my plan was: Patient is slowly recovering.  He is no longer hypoxic at rest although he still requires oxygen with bathing and activities of daily living.  He still has diminished breath sounds and decreased air movement suggesting decrease lung capacity.  This will gradually improve with time hopefully.  However he requires additional time for convalescence.  At the present time he has significant dyspnea on exertion that prevents him from working.  He also has muscular deconditioning that prevents him from working.  Therefore I have recommended 4 weeks of total disability.  I will reassess the patient on a weekly basis in case he recovers more quickly than anticipated.  I will see the patient back next week and reassess.  We will check a CBC today to monitor for any bone marrow suppression due to the virus or any evidence of anemia.  I feel that he is far enough out the window now that he can stop aspirin for DVT prophylaxis and because he stopping aspirin he  can also stop pantoprazole.  I would only resume his omeprazole if he starts to take his meloxicam daily again.  He does have an rash consistent with Candida intertrigo underneath his abdominal pannus as well as in the space between his scrotum and thigh on the left side.  We will treat this with Lotrimin cream twice daily for 2 weeks.  We also discussed hygiene measures to help prevent this from reoccurring.  I will monitor his renal function and liver function test with a CMP.  I recommended that we hold off on resuming his fenofibrate until his liver function test have improved and his muscular deconditioning has improved.  Reassess the patient in 1 week.  12/20/19 Patient has been off oxygen now for the last 3 days per his report.  He is able to perform activities of  daily living such as bathing, toileting, etc. without wearing oxygen.  He states that his oxygen levels at home have been between 93 and 95% off oxygen.  He denies any chest pain.  He denies any shortness of breath at rest.  He does have fatigue and deconditioning however this is improving.  On exam today, the patient's breath sounds have improved dramatically.  He is moving better air and there is no expiratory wheezing. Wt Readings from Last 3 Encounters:  01/03/20 290 lb (131.5 kg)  12/20/19 280 lb (127 kg)  12/13/19 280 lb (127 kg)   Patient's weight is stable from his last visit.  He states that he is tolerating food without any difficulty.  He denies any nausea or vomiting or diarrhea.  At that time, my plan was: Patient is doing much better.  I recommended that he refrain from working 1 additional week.  I believe at that point he can return to work via working from home.  He will resume working from home in 1 week.  I would like the patient to work from home for 2 weeks at that point to avoid possibly acquiring a secondary infection from going back to work during flu season which may cause the patient to deteriorate rapidly.  If the patient is doing well he can resume work with a normal schedule in March.  01/03/20 Patient is doing much better.  He seems to be back to his baseline.  Yesterday he went for a walk in his neighborhood and walk for more than 25 minutes.  He admits that he was very winded when he got back however he believes majority of this is deconditioning as he is essentially not done any exercise since before Christmas.  He denies any hemoptysis.  He denies any pleurisy.  He denies any wheezing.  He denies any nausea or vomiting or diarrhea.  He does have some nasal congestion but this is minor.  He has been working from home recently but would like to return to work on a regular basis now.  He denies any fevers or chills.  He denies any weakness.    Past Medical History:    Diagnosis Date  . GERD (gastroesophageal reflux disease)   . 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 OSA (obstructive sleep apnea) 09/06/2016   AHI-30, CPAP   Past Surgical History:  Procedure Laterality Date  . DG 4TH DIGIT RIGHT HAND    . HERNIA REPAIR    . TONSILLECTOMY    . WISDOM TOOTH EXTRACTION     Current Outpatient Medications on File Prior to Visit  Medication  Sig Dispense Refill  . Ascorbic Acid (VITAMIN C) 1000 MG tablet Take 1,000 mg by mouth 2 (two) times daily.     . Cholecalciferol (VITAMIN D) 2000 units CAPS Take by mouth.    . meloxicam (MOBIC) 15 MG tablet Take 15 mg by mouth daily. PRN    . omeprazole (PRILOSEC) 40 MG capsule Take 40 mg by mouth daily.    [name] OVER THE COUNTER MEDICATION Take 1 tablet by mouth daily. Multi Vitamin - [employer] Brand    . fenofibrate 160 MG tablet TAKE 1 TABLET BY MOUTH EVERY DAY (Patient not taking: Reported on 12/13/2019) 90 tablet 1   No current facility-administered medications on file prior to visit.   No Known Allergies Social History   Socioeconomic History  . Marital status: Married    Spouse name: Not on file  . Number of children: Not on file  . Years of education: Not on file  . Highest education level: Not on file  Occupational History  . Not on file  Tobacco Use  . Smoking status: Never Smoker  . Smokeless tobacco: Never Used  Substance and Sexual Activity  . Alcohol use: Yes    Alcohol/week: 0.0 standard drinks    Comment: Very seldom - beer  . Drug use: No  . Sexual activity: Not on file  Other Topics Concern  . Not on file  Social History Narrative  . Not on file   Social Determinants of Health   Financial Resource Strain:   . Difficulty of Paying Living Expenses: Not on file  Food Insecurity:   . Worried About Charity fundraiser in the Last Year: Not on file  . Ran Out of Food in the Last Year: Not on file  Transportation Needs:   . Lack of Transportation (Medical): Not on file   . Lack of Transportation (Non-Medical): Not on file  Physical Activity:   . Days of Exercise per Week: Not on file  . Minutes of Exercise per Session: Not on file  Stress:   . Feeling of Stress : Not on file  Social Connections:   . Frequency of Communication with Friends and Family: Not on file  . Frequency of Social Gatherings with Friends and Family: Not on file  . Attends Religious Services: Not on file  . Active Member of Clubs or Organizations: Not on file  . Attends Archivist Meetings: Not on file  . Marital Status: Not on file  Intimate Partner Violence:   . Fear of Current or Ex-Partner: Not on file  . Emotionally Abused: Not on file  . Physically Abused: Not on file  . Sexually Abused: Not on file    Review of Systems     Objective:   Physical Exam Vitals reviewed.  Constitutional:      General: He is not in acute distress.    Appearance: Normal appearance. He is not ill-appearing, toxic-appearing or diaphoretic.  Cardiovascular:     Rate and Rhythm: Normal rate and regular rhythm.     Heart sounds: Normal heart sounds. No murmur. No friction rub. No gallop.   Pulmonary:     Effort: Pulmonary effort is normal. No respiratory distress.     Breath sounds: No decreased air movement. No decreased breath sounds, wheezing, rhonchi or rales.  Musculoskeletal:     Right lower leg: No edema.     Left lower leg: No edema.  Neurological:     Mental Status: He is alert.  Assessment & Plan:  COVID-19 - Plan: DG Chest 2 View  Hypertriglyceridemia - Plan: COMPLETE METABOLIC PANEL WITH GFR, Lipid panel Clinically, the patient appears to be back to his baseline.  I believe the patient can return to work without restrictions.  I have recommended that he wear a mask to help prevent him from catching an upper respiratory illness.  However at this point the patient is no longer contagious and his breathing has returned to the point where he is able to  work a full schedule without any restrictions.

## 2020-01-07 ENCOUNTER — Other Ambulatory Visit: Payer: BC Managed Care – PPO

## 2020-01-07 ENCOUNTER — Other Ambulatory Visit: Payer: Self-pay

## 2020-01-07 DIAGNOSIS — E781 Pure hyperglyceridemia: Secondary | ICD-10-CM

## 2020-01-07 LAB — LIPID PANEL
Cholesterol: 233 mg/dL — ABNORMAL HIGH (ref <200)
HDL: 51 mg/dL (ref >=40)
LDL Cholesterol (Calc): 150 mg/dL (calc) — ABNORMAL HIGH
Non-HDL Cholesterol (Calc): 182 mg/dL (calc) — ABNORMAL HIGH (ref <130)
Total CHOL/HDL Ratio: 4.6 (calc) (ref <5.0)
Triglycerides: 184 mg/dL — ABNORMAL HIGH (ref <150)

## 2020-01-07 LAB — COMPLETE METABOLIC PANEL WITH GFR
AG Ratio: 1.7 (calc) (ref 1.0–2.5)
ALT: 25 U/L (ref 9–46)
AST: 21 U/L (ref 10–35)
Albumin: 4.2 g/dL (ref 3.6–5.1)
Alkaline phosphatase (APISO): 67 U/L (ref 35–144)
BUN: 13 mg/dL (ref 7–25)
CO2: 28 mmol/L (ref 20–32)
Calcium: 9.6 mg/dL (ref 8.6–10.3)
Chloride: 101 mmol/L (ref 98–110)
Creat: 1.09 mg/dL (ref 0.70–1.33)
GFR, Est African American: 89 mL/min/{1.73_m2} (ref >=60)
GFR, Est Non African American: 77 mL/min/{1.73_m2} (ref >=60)
Globulin: 2.5 g/dL (calc) (ref 1.9–3.7)
Glucose, Bld: 98 mg/dL (ref 65–99)
Potassium: 4.5 mmol/L (ref 3.5–5.3)
Sodium: 139 mmol/L (ref 135–146)
Total Bilirubin: 0.5 mg/dL (ref 0.2–1.2)
Total Protein: 6.7 g/dL (ref 6.1–8.1)

## 2020-01-08 ENCOUNTER — Ambulatory Visit
Admission: RE | Admit: 2020-01-08 | Discharge: 2020-01-08 | Disposition: A | Discharge status: Home / Self Care | Payer: BC Managed Care – PPO | Source: Ambulatory Visit | Attending: Family Medicine | Admitting: Family Medicine

## 2020-01-08 DIAGNOSIS — U071 COVID-19: Secondary | ICD-10-CM

## 2020-01-08 DIAGNOSIS — J181 Lobar pneumonia, unspecified organism: Secondary | ICD-10-CM | POA: Diagnosis not present

## 2020-01-28 ENCOUNTER — Encounter: Payer: Self-pay | Admitting: Family Medicine

## 2020-03-18 ENCOUNTER — Other Ambulatory Visit: Payer: Self-pay | Admitting: Family Medicine

## 2020-03-18 MED ORDER — FENOFIBRATE 160 MG PO TABS: 160.0000 mg | ORAL_TABLET | Freq: Every day | ORAL | 1 refills | Status: DC

## 2020-04-02 DIAGNOSIS — G4733 Obstructive sleep apnea (adult) (pediatric): Secondary | ICD-10-CM | POA: Diagnosis not present

## 2020-04-27 ENCOUNTER — Other Ambulatory Visit: Payer: Self-pay

## 2020-04-27 MED ORDER — MELOXICAM 15 MG PO TABS: 15.0000 mg | ORAL_TABLET | Freq: Every day | ORAL | 0 refills | Status: DC

## 2020-04-27 NOTE — Progress Notes (Signed)
Last refilled: Historical provider

## 2020-05-04 ENCOUNTER — Other Ambulatory Visit: Payer: Self-pay

## 2020-05-04 ENCOUNTER — Ambulatory Visit (INDEPENDENT_AMBULATORY_CARE_PROVIDER_SITE_OTHER): Payer: BC Managed Care – PPO | Admitting: Family Medicine

## 2020-05-04 VITALS — BP 140/72 | HR 72 | Temp 94.3°F | Wt 306.0 lb

## 2020-05-04 DIAGNOSIS — M25562 Pain in left knee: Secondary | ICD-10-CM

## 2020-05-04 DIAGNOSIS — Z0000 Encounter for general adult medical examination without abnormal findings: Secondary | ICD-10-CM

## 2020-05-04 DIAGNOSIS — Z0001 Encounter for general adult medical examination with abnormal findings: Secondary | ICD-10-CM

## 2020-05-04 DIAGNOSIS — E781 Pure hyperglyceridemia: Secondary | ICD-10-CM | POA: Diagnosis not present

## 2020-05-04 NOTE — Progress Notes (Signed)
Subjective:    Patient ID: Adam [name], male    DOB: [birth year], 54 y.o.   MRN: [id number]  HPI Patient is a very pleasant 54 year old Caucasian male here today for complete physical exam.  This winter, the patient acquired COVID-19 and suffered a prolonged hospitalization coupled with hypoxia and respiratory failure and pneumonia.  Thankfully, the patient appears to be almost back to his baseline.  Today on exam, his lungs are completely clear with no crackles or rails.  We did repeat a chest x-ray in February that showed near complete resolution of his pneumonia and therefore I do not see any reason to repeat imaging at the present time.  His last colonoscopy was in 2017 and is due again next year.  He is due for prostate cancer screening.  His immunizations including Covid are up-to-date.  He is due for the shingles vaccine and we discussed this.  I also offered the patient the pneumonia vaccine, Pneumovax 23, but he politely declined at the present time.  Otherwise his only concern is instability in the left knee.  He has been exercising and recently at the gym he suffered an injury to the left knee.  He reports some left lateral posterior discomfort as well as a feeling of instability with flexion and extension.  There is no laxity to varus or valgus stress.  He has a negative anterior and posterior drawer sign although there is some mild discomfort with Apley grind suggesting possibly a meniscal tear.  Also a possibility would be arthritic pain and discomfort.  We discussed his options and I believe physical therapy would be his best option at the present time.  He is in agreement.  Otherwise he is doing well with no concerns. Past Medical History:  Diagnosis Date  . GERD (gastroesophageal reflux disease)   . Hyperlipidemia    [employer]reesia 05/01/2020  . 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 OSA (obstructive sleep apnea) 09/06/2016   AHI-30, CPAP   Past Surgical History:   Procedure Laterality Date  . DG 4TH DIGIT RIGHT HAND    . HERNIA REPAIR    . TONSILLECTOMY    . WISDOM TOOTH EXTRACTION     Current Outpatient Medications on File Prior to Visit  Medication Sig Dispense Refill  . Ascorbic Acid (VITAMIN C) 1000 MG tablet Take 1,000 mg by mouth 2 (two) times daily.     . Cholecalciferol (VITAMIN D) 2000 units CAPS Take by mouth.    . fenofibrate 160 MG tablet Take 1 tablet (160 mg total) by mouth daily. 90 tablet 1  . meloxicam (MOBIC) 15 MG tablet Take 1 tablet (15 mg total) by mouth daily. PRN 30 tablet 0  . omeprazole (PRILOSEC) 40 MG capsule Take 40 mg by mouth daily.    [name] OVER THE COUNTER MEDICATION Take 1 tablet by mouth daily. Multi Vitamin - [employer] Brand     No current facility-administered medications on file prior to visit.   No Known Allergies Social History   Socioeconomic History  . Marital status: Married    Spouse name: Not on file  . Number of children: Not on file  . Years of education: Not on file  . Highest education level: Not on file  Occupational History  . Not on file  Tobacco Use  . Smoking status: Never Smoker  . Smokeless tobacco: Never Used  Substance and Sexual Activity  . Alcohol use: Yes    Alcohol/week:  0.0 standard drinks    Comment: Very seldom - beer  . Drug use: No  . Sexual activity: Not on file  Other Topics Concern  . Not on file  Social History Narrative  . Not on file   Social Determinants of Health   Financial Resource Strain:   . Difficulty of Paying Living Expenses:   Food Insecurity:   . Worried About Charity fundraiser in the Last Year:   . Arboriculturist in the Last Year:   Transportation Needs:   . Film/video editor (Medical):   [name] Lack of Transportation (Non-Medical):   Physical Activity:   . Days of Exercise per Week:   . Minutes of Exercise per Session:   Stress:   . Feeling of Stress :   Social Connections:   . Frequency of Communication with Friends and Family:   .  Frequency of Social Gatherings with Friends and Family:   . Attends Religious Services:   . Active Member of Clubs or Organizations:   . Attends Archivist Meetings:   [name] Marital Status:   Intimate Partner Violence:   . Fear of Current or Ex-Partner:   . Emotionally Abused:   [name] Physically Abused:   . Sexually Abused:    Family History  Problem Relation Age of Onset  . Dementia Mother   . Atrial fibrillation Father   . Colon polyps Father   . COPD Maternal Grandfather   . Colon cancer Neg Hx   . Esophageal cancer Neg Hx   . Rectal cancer Neg Hx   . Stomach cancer Neg Hx       Review of Systems  All other systems reviewed and are negative.      Objective:   Physical Exam Vitals reviewed.  Constitutional:      General: He is not in acute distress.    Appearance: He is well-developed. He is not diaphoretic.  HENT:     Head: Normocephalic and atraumatic.     Right Ear: External ear normal.     Left Ear: External ear normal.     Nose: Nose normal.     Mouth/Throat:     Pharynx: No oropharyngeal exudate.  Eyes:     General: No scleral icterus.       Right eye: No discharge.        Left eye: No discharge.     Conjunctiva/sclera: Conjunctivae normal.     Pupils: Pupils are equal, round, and reactive to light.  Neck:     Thyroid: No thyromegaly.     Vascular: No JVD.     Trachea: No tracheal deviation.  Cardiovascular:     Rate and Rhythm: Normal rate and regular rhythm.     Heart sounds: Normal heart sounds. No murmur heard.  No friction rub. No gallop.   Pulmonary:     Effort: Pulmonary effort is normal. No respiratory distress.     Breath sounds: Normal breath sounds. No stridor. No wheezing or rales.  Chest:     Chest wall: No tenderness.  Abdominal:     General: Bowel sounds are normal. There is no distension.     Palpations: Abdomen is soft. There is no mass.     Tenderness: There is no abdominal tenderness. There is no guarding or rebound.   Genitourinary:    Testes: Cremasteric reflex is present.  Musculoskeletal:        General: No tenderness. Normal range of motion.  Cervical back: Normal range of motion and neck supple.  Lymphadenopathy:     Cervical: No cervical adenopathy.  Skin:    General: Skin is warm.     Coloration: Skin is not pale.     Findings: No erythema or rash.  Neurological:     Mental Status: He is alert and oriented to person, place, and time.     Cranial Nerves: No cranial nerve deficit.     Motor: No abnormal muscle tone.     Coordination: Coordination normal.     Deep Tendon Reflexes: Reflexes are normal and symmetric.  Psychiatric:        Behavior: Behavior normal.        Thought Content: Thought content normal.        Judgment: Judgment normal.           Assessment & Plan:  Hypertriglyceridemia - Plan: CBC with Differential/Platelet, COMPLETE METABOLIC PANEL WITH GFR, Lipid panel  General medical exam - Plan: CBC with Differential/Platelet, COMPLETE METABOLIC PANEL WITH GFR, Lipid panel, PSA  Acute pain of left knee - Plan: Ambulatory referral to Physical Therapy    Physical exam is completely normal except for obesity. I recommended diet exercise and weight loss. I will check a CBC, CMP, fasting lipid panel, and a PSA.  I will consult physical therapy regarding the instability in his left knee.  I suspect underlying arthritis as well as possibly a meniscal tear.  Colonoscopy is due in 2022.  Recommended the shingles vaccine however the remainder of his preventative care is up-to-date.

## 2020-05-05 LAB — CBC WITH DIFFERENTIAL/PLATELET
Absolute Monocytes: 478 cells/uL (ref 200–950)
Basophils Absolute: 68 cells/uL (ref 0–200)
Basophils Relative: 1.3 %
Eosinophils Absolute: 208 cells/uL (ref 15–500)
Eosinophils Relative: 4 %
HCT: 45.8 % (ref 38.5–50.0)
Hemoglobin: 15.6 g/dL (ref 13.2–17.1)
Lymphs Abs: 1674 cells/uL (ref 850–3900)
MCH: 28.7 pg (ref 27.0–33.0)
MCHC: 34.1 g/dL (ref 32.0–36.0)
MCV: 84.2 fL (ref 80.0–100.0)
MPV: 10.7 fL (ref 7.5–12.5)
Monocytes Relative: 9.2 %
Neutro Abs: 2772 cells/uL (ref 1500–7800)
Neutrophils Relative %: 53.3 %
Platelets: 243 10*3/uL (ref 140–400)
RBC: 5.44 10*6/uL (ref 4.20–5.80)
RDW: 12.8 % (ref 11.0–15.0)
Total Lymphocyte: 32.2 %
WBC: 5.2 10*3/uL (ref 3.8–10.8)

## 2020-05-05 LAB — COMPLETE METABOLIC PANEL WITH GFR
AG Ratio: 2 (calc) (ref 1.0–2.5)
ALT: 20 U/L (ref 9–46)
AST: 20 U/L (ref 10–35)
Albumin: 4.5 g/dL (ref 3.6–5.1)
Alkaline phosphatase (APISO): 47 U/L (ref 35–144)
BUN: 17 mg/dL (ref 7–25)
CO2: 22 mmol/L (ref 20–32)
Calcium: 9.5 mg/dL (ref 8.6–10.3)
Chloride: 104 mmol/L (ref 98–110)
Creat: 1.23 mg/dL (ref 0.70–1.33)
GFR, Est African American: 77 mL/min/{1.73_m2} (ref >=60)
GFR, Est Non African American: 66 mL/min/{1.73_m2} (ref >=60)
Globulin: 2.3 g/dL (calc) (ref 1.9–3.7)
Glucose, Bld: 99 mg/dL (ref 65–99)
Potassium: 4 mmol/L (ref 3.5–5.3)
Sodium: 139 mmol/L (ref 135–146)
Total Bilirubin: 0.5 mg/dL (ref 0.2–1.2)
Total Protein: 6.8 g/dL (ref 6.1–8.1)

## 2020-05-05 LAB — LIPID PANEL
Cholesterol: 192 mg/dL (ref <200)
HDL: 63 mg/dL (ref >=40)
LDL Cholesterol (Calc): 107 mg/dL (calc) — ABNORMAL HIGH
Non-HDL Cholesterol (Calc): 129 mg/dL (calc) (ref <130)
Total CHOL/HDL Ratio: 3 (calc) (ref <5.0)
Triglycerides: 119 mg/dL (ref <150)

## 2020-05-05 LAB — PSA: PSA: 1.3 ng/mL (ref <=4.0)

## 2020-06-05 DIAGNOSIS — M25562 Pain in left knee: Secondary | ICD-10-CM | POA: Diagnosis not present

## 2020-06-06 ENCOUNTER — Other Ambulatory Visit: Payer: Self-pay | Admitting: Family Medicine

## 2020-07-06 DIAGNOSIS — M25562 Pain in left knee: Secondary | ICD-10-CM | POA: Diagnosis not present

## 2020-08-03 DIAGNOSIS — M25562 Pain in left knee: Secondary | ICD-10-CM | POA: Diagnosis not present

## 2020-08-10 ENCOUNTER — Other Ambulatory Visit: Payer: Self-pay | Admitting: Family Medicine

## 2020-09-08 ENCOUNTER — Other Ambulatory Visit: Payer: Self-pay | Admitting: Family Medicine

## 2020-10-05 ENCOUNTER — Other Ambulatory Visit: Payer: Self-pay | Admitting: Family Medicine

## 2020-10-05 NOTE — Telephone Encounter (Signed)
Please Advise

## 2020-10-15 ENCOUNTER — Telehealth: Payer: Self-pay | Admitting: Nurse Practitioner

## 2020-10-15 ENCOUNTER — Other Ambulatory Visit: Payer: Self-pay | Admitting: Neurology

## 2020-10-15 DIAGNOSIS — G4726 Circadian rhythm sleep disorder, shift work type: Secondary | ICD-10-CM

## 2020-10-15 DIAGNOSIS — G4733 Obstructive sleep apnea (adult) (pediatric): Secondary | ICD-10-CM

## 2020-10-15 NOTE — Telephone Encounter (Signed)
Pt was previously on Auto 5-15 cm water pressure and after covid had a hard time tolerating the pressure. The max pressure was decreased to 9 cmm water pressure. In looking at current download the patient is maxing out on the machine and having residual AHI of 10. Max pressure was increased back to 15 where previously was set. Order sent for the pt to [facility]).   Adam [name]

## 2020-10-15 NOTE — Telephone Encounter (Signed)
Pt has called requesting the settings on his CPAP be reversed to the initial settings before he reported having Covid-19

## 2020-11-13 ENCOUNTER — Other Ambulatory Visit: Payer: Self-pay | Admitting: Family Medicine

## 2020-11-16 MED ORDER — OMEPRAZOLE 40 MG PO CPDR: DELAYED_RELEASE_CAPSULE | ORAL | 1 refills | Status: DC

## 2020-11-16 NOTE — Addendum Note (Signed)
Addended by: Phillips Odor on: 11/16/2020 02:06 PM   Modules accepted: Orders

## 2020-12-21 DIAGNOSIS — G4733 Obstructive sleep apnea (adult) (pediatric): Secondary | ICD-10-CM | POA: Diagnosis not present

## 2021-02-02 ENCOUNTER — Other Ambulatory Visit: Payer: Self-pay | Admitting: Family Medicine

## 2021-03-06 ENCOUNTER — Other Ambulatory Visit: Payer: Self-pay | Admitting: Family Medicine

## 2021-03-26 ENCOUNTER — Other Ambulatory Visit: Payer: Self-pay

## 2021-03-26 ENCOUNTER — Ambulatory Visit (INDEPENDENT_AMBULATORY_CARE_PROVIDER_SITE_OTHER): Payer: BC Managed Care – PPO | Admitting: Family Medicine

## 2021-03-26 ENCOUNTER — Encounter: Payer: Self-pay | Admitting: Family Medicine

## 2021-03-26 VITALS — BP 126/80 | HR 70 | Temp 98.2°F | Resp 16 | Ht 73.0 in | Wt 317.0 lb

## 2021-03-26 DIAGNOSIS — R0600 Dyspnea, unspecified: Secondary | ICD-10-CM

## 2021-03-26 DIAGNOSIS — K429 Umbilical hernia without obstruction or gangrene: Secondary | ICD-10-CM | POA: Diagnosis not present

## 2021-03-26 DIAGNOSIS — Z0000 Encounter for general adult medical examination without abnormal findings: Secondary | ICD-10-CM

## 2021-03-26 DIAGNOSIS — Z125 Encounter for screening for malignant neoplasm of prostate: Secondary | ICD-10-CM | POA: Diagnosis not present

## 2021-03-26 DIAGNOSIS — Z136 Encounter for screening for cardiovascular disorders: Secondary | ICD-10-CM | POA: Diagnosis not present

## 2021-03-26 DIAGNOSIS — Z13220 Encounter for screening for lipoid disorders: Secondary | ICD-10-CM | POA: Diagnosis not present

## 2021-03-26 NOTE — Progress Notes (Signed)
Subjective:    Patient ID: Adam [name], male    DOB: [birth year], 55 y.o.   MRN: [id number]  HPI Patient is a very pleasant 55 year old Caucasian male who presents today with 2 concerns.  Ever since he had COVID, he states that he has had dyspnea on exertion.  He states that he figures he would have recovered by now.  He states that he gets extremely winded walking up a flight of steps.  He believes that this is out of proportion to his age and his level of conditioning.  He denies any angina or syncope or near syncope.  There is no peripheral edema.  He denies any orthopnea.  He denies any paroxysmal nocturnal dyspnea.  He denies any tachycardia.  He also denies any cough or wheezing.  His pulmonary exam today is normal.  I do not appreciate any crackles or rails to suggest underlying scar tissue or fibrosis.  He does not smoke.  He has no history of asthma.  He denies any melena or hematochezia.  Second issue, the patient has a hernia.  Has an umbilicus hernia that is roughly 3 cm in diameter.  It is freely reducible and nontender. Past Medical History:  Diagnosis Date  . GERD (gastroesophageal reflux disease)   . Hyperlipidemia    Phreesia 05/01/2020  . 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 OSA (obstructive sleep apnea) 09/06/2016   AHI-30, CPAP   Past Surgical History:  Procedure Laterality Date  . DG 4TH DIGIT RIGHT HAND    . HERNIA REPAIR    . TONSILLECTOMY    . WISDOM TOOTH EXTRACTION     Current Outpatient Medications on File Prior to Visit  Medication Sig Dispense Refill  . Ascorbic Acid (VITAMIN C) 1000 MG tablet Take 1,000 mg by mouth 2 (two) times daily.     . Cholecalciferol (VITAMIN D) 2000 units CAPS Take by mouth.    . fenofibrate 160 MG tablet TAKE 1 TABLET BY MOUTH EVERY DAY 90 tablet 0  . meloxicam (MOBIC) 15 MG tablet TAKE 1 TABLET BY MOUTH DAILY AS NEEDED. 30 tablet 3  . omeprazole (PRILOSEC) 20 MG capsule Take 20 mg by mouth daily. OTC    .  OVER THE COUNTER MEDICATION Take 1 tablet by mouth daily. Multi Vitamin - [employer] Brand     No current facility-administered medications on file prior to visit.   No Known Allergies Social History   Socioeconomic History  . Marital status: Married    Spouse name: Not on file  . Number of children: Not on file  . Years of education: Not on file  . Highest education level: Not on file  Occupational History  . Not on file  Tobacco Use  . Smoking status: Never Smoker  . Smokeless tobacco: Never Used  Substance and Sexual Activity  . Alcohol use: Yes    Alcohol/week: 0.0 standard drinks    Comment: Very seldom - beer  . Drug use: No  . Sexual activity: Not on file  Other Topics Concern  . Not on file  Social History Narrative  . Not on file   Social Determinants of Health   Financial Resource Strain: Not on file  Food Insecurity: Not on file  Transportation Needs: Not on file  Physical Activity: Not on file  Stress: Not on file  Social Connections: Not on file  Intimate Partner Violence: Not on file   Family History  Problem  Relation Age of Onset  . Dementia Mother   . Atrial fibrillation Father   . Colon polyps Father   . COPD Maternal Grandfather   . Colon cancer Neg Hx   . Esophageal cancer Neg Hx   . Rectal cancer Neg Hx   . Stomach cancer Neg Hx       Review of Systems  All other systems reviewed and are negative.      Objective:   Physical Exam Vitals reviewed.  Constitutional:      General: He is not in acute distress.    Appearance: He is well-developed. He is not diaphoretic.  HENT:     Head: Normocephalic and atraumatic.     Right Ear: External ear normal.     Left Ear: External ear normal.     Nose: Nose normal.     Mouth/Throat:     Pharynx: No oropharyngeal exudate.  Eyes:     General: No scleral icterus.       Right eye: No discharge.        Left eye: No discharge.     Conjunctiva/sclera: Conjunctivae normal.     Pupils: Pupils are  equal, round, and reactive to light.  Neck:     Thyroid: No thyromegaly.     Vascular: No JVD.     Trachea: No tracheal deviation.  Cardiovascular:     Rate and Rhythm: Normal rate and regular rhythm.     Heart sounds: Normal heart sounds. No murmur heard. No friction rub. No gallop.   Pulmonary:     Effort: Pulmonary effort is normal. No respiratory distress.     Breath sounds: Normal breath sounds. No stridor. No wheezing or rales.  Chest:     Chest wall: No tenderness.  Abdominal:     General: Bowel sounds are normal. There is no distension.     Palpations: Abdomen is soft. There is no mass.     Tenderness: There is no abdominal tenderness. There is no guarding or rebound.     Hernia: A hernia is present.    Genitourinary:    Testes: Cremasteric reflex is present.  Musculoskeletal:        General: No tenderness. Normal range of motion.     Cervical back: Normal range of motion and neck supple.  Lymphadenopathy:     Cervical: No cervical adenopathy.  Skin:    General: Skin is warm.     Coloration: Skin is not pale.     Findings: No erythema or rash.  Neurological:     Mental Status: He is alert and oriented to person, place, and time.     Cranial Nerves: No cranial nerve deficit.     Motor: No abnormal muscle tone.     Coordination: Coordination normal.     Deep Tendon Reflexes: Reflexes are normal and symmetric.  Psychiatric:        Behavior: Behavior normal.        Thought Content: Thought content normal.        Judgment: Judgment normal.           Assessment & Plan:  General medical exam - Plan: CBC with Differential/Platelet, COMPLETE METABOLIC PANEL WITH GFR, Lipid panel, PSA  Dyspnea, unspecified type - Plan: DG Chest 2 View, ECHOCARDIOGRAM COMPLETE  Umbilical hernia without obstruction and without gangrene  Obtain lab work for his physical exam including CBC CMP lipid panel and PSA.  Discussed umbilical hernias.  Discussed warning signs of  incarceration.  Offered general surgery consultation.  He plans to discuss this with his wife and then let me know his decision.  Regarding dyspnea on exertion, I recommend an echocardiogram particularly given his BMI and his history of obstructive sleep apnea to rule out heart failure or any evidence of decreased ventricular function.  Obtain chest x-ray given his history of COVID to evaluate for any residual scar tissue.  Check CBC to rule out anemia.  Consider pulmonary function test.

## 2021-03-27 LAB — COMPLETE METABOLIC PANEL WITH GFR
AG Ratio: 2.1 (calc) (ref 1.0–2.5)
ALT: 28 U/L (ref 9–46)
AST: 25 U/L (ref 10–35)
Albumin: 4.7 g/dL (ref 3.6–5.1)
Alkaline phosphatase (APISO): 48 U/L (ref 35–144)
BUN: 14 mg/dL (ref 7–25)
CO2: 25 mmol/L (ref 20–32)
Calcium: 9.8 mg/dL (ref 8.6–10.3)
Chloride: 102 mmol/L (ref 98–110)
Creat: 1.19 mg/dL (ref 0.70–1.33)
GFR, Est African American: 79 mL/min/{1.73_m2} (ref >=60)
GFR, Est Non African American: 68 mL/min/{1.73_m2} (ref >=60)
Globulin: 2.2 g/dL (calc) (ref 1.9–3.7)
Glucose, Bld: 99 mg/dL (ref 65–99)
Potassium: 4.1 mmol/L (ref 3.5–5.3)
Sodium: 138 mmol/L (ref 135–146)
Total Bilirubin: 0.7 mg/dL (ref 0.2–1.2)
Total Protein: 6.9 g/dL (ref 6.1–8.1)

## 2021-03-27 LAB — CBC WITH DIFFERENTIAL/PLATELET
Absolute Monocytes: 526 cells/uL (ref 200–950)
Basophils Absolute: 101 cells/uL (ref 0–200)
Basophils Relative: 1.8 %
Eosinophils Absolute: 218 cells/uL (ref 15–500)
Eosinophils Relative: 3.9 %
HCT: 45.8 % (ref 38.5–50.0)
Hemoglobin: 15.5 g/dL (ref 13.2–17.1)
Lymphs Abs: 1652 cells/uL (ref 850–3900)
MCH: 28.7 pg (ref 27.0–33.0)
MCHC: 33.8 g/dL (ref 32.0–36.0)
MCV: 84.7 fL (ref 80.0–100.0)
MPV: 10.4 fL (ref 7.5–12.5)
Monocytes Relative: 9.4 %
Neutro Abs: 3102 cells/uL (ref 1500–7800)
Neutrophils Relative %: 55.4 %
Platelets: 255 10*3/uL (ref 140–400)
RBC: 5.41 10*6/uL (ref 4.20–5.80)
RDW: 12.2 % (ref 11.0–15.0)
Total Lymphocyte: 29.5 %
WBC: 5.6 10*3/uL (ref 3.8–10.8)

## 2021-03-27 LAB — PSA: PSA: 1.18 ng/mL (ref <=4.00)

## 2021-03-27 LAB — LIPID PANEL
Cholesterol: 203 mg/dL — ABNORMAL HIGH (ref <200)
HDL: 59 mg/dL (ref >=40)
LDL Cholesterol (Calc): 125 mg/dL (calc) — ABNORMAL HIGH
Non-HDL Cholesterol (Calc): 144 mg/dL (calc) — ABNORMAL HIGH (ref <130)
Total CHOL/HDL Ratio: 3.4 (calc) (ref <5.0)
Triglycerides: 93 mg/dL (ref <150)

## 2021-03-29 ENCOUNTER — Other Ambulatory Visit: Payer: Self-pay | Admitting: *Deleted

## 2021-03-29 ENCOUNTER — Telehealth: Payer: Self-pay | Admitting: *Deleted

## 2021-03-29 MED ORDER — OMEPRAZOLE 40 MG PO CPDR
40.0000 mg | DELAYED_RELEASE_CAPSULE | Freq: Every day | ORAL | 3 refills | Status: DC
Start: 2021-03-29 — End: 2022-03-21

## 2021-03-29 MED ORDER — ATORVASTATIN CALCIUM 20 MG PO TABS: 20.0000 mg | ORAL_TABLET | Freq: Every day | ORAL | 0 refills | Status: DC

## 2021-03-29 NOTE — Telephone Encounter (Signed)
Received request from pharmacy for PA on Omeprazole.   PA submitted.   Dx: K21.9- GERD  Received immediate determination.   PA approved 03/29/2021 - 03/28/2024.

## 2021-03-30 ENCOUNTER — Encounter: Payer: Self-pay | Admitting: *Deleted

## 2021-04-08 ENCOUNTER — Ambulatory Visit
Admission: RE | Admit: 2021-04-08 | Discharge: 2021-04-08 | Disposition: A | Discharge status: Home / Self Care | Payer: BC Managed Care – PPO | Source: Ambulatory Visit | Attending: Family Medicine | Admitting: Family Medicine

## 2021-04-08 ENCOUNTER — Ambulatory Visit (HOSPITAL_COMMUNITY): Payer: BC Managed Care – PPO | Attending: Cardiology

## 2021-04-08 ENCOUNTER — Other Ambulatory Visit: Payer: Self-pay

## 2021-04-08 DIAGNOSIS — R0600 Dyspnea, unspecified: Secondary | ICD-10-CM

## 2021-04-08 DIAGNOSIS — R0609 Other forms of dyspnea: Secondary | ICD-10-CM | POA: Diagnosis not present

## 2021-04-08 LAB — ECHOCARDIOGRAM COMPLETE
Area-P 1/2: 2.48 cm2
S' Lateral: 3.2 cm

## 2021-04-08 MED ORDER — PERFLUTREN LIPID MICROSPHERE
1.0000 mL | INTRAVENOUS | Status: AC | PRN
  Administered 2021-04-08: 1 mL via INTRAVENOUS

## 2021-04-09 ENCOUNTER — Encounter: Payer: Self-pay | Admitting: Family Medicine

## 2021-04-09 ENCOUNTER — Other Ambulatory Visit: Payer: Self-pay

## 2021-04-09 ENCOUNTER — Ambulatory Visit (INDEPENDENT_AMBULATORY_CARE_PROVIDER_SITE_OTHER): Payer: BC Managed Care – PPO | Admitting: Family Medicine

## 2021-04-09 VITALS — BP 126/84 | HR 80 | Temp 98.2°F | Resp 16 | Ht 73.0 in | Wt 316.0 lb

## 2021-04-09 DIAGNOSIS — Z0000 Encounter for general adult medical examination without abnormal findings: Secondary | ICD-10-CM

## 2021-04-09 DIAGNOSIS — Z0001 Encounter for general adult medical examination with abnormal findings: Secondary | ICD-10-CM | POA: Diagnosis not present

## 2021-04-09 DIAGNOSIS — Z9989 Dependence on other enabling machines and devices: Secondary | ICD-10-CM

## 2021-04-09 DIAGNOSIS — K429 Umbilical hernia without obstruction or gangrene: Secondary | ICD-10-CM | POA: Diagnosis not present

## 2021-04-09 DIAGNOSIS — G4733 Obstructive sleep apnea (adult) (pediatric): Secondary | ICD-10-CM

## 2021-04-09 NOTE — Progress Notes (Signed)
Subjective:    Patient ID: Adam [name], male    DOB: [birth year], 55 y.o.   MRN: [id number]  HPI  03/26/21 Patient is a very pleasant 55 year old Caucasian male who presents today with 2 concerns.  Ever since he had COVID, he states that he has had dyspnea on exertion.  He states that he figures he would have recovered by now.  He states that he gets extremely winded walking up a flight of steps.  He believes that this is out of proportion to his age and his level of conditioning.  He denies any angina or syncope or near syncope.  There is no peripheral edema.  He denies any orthopnea.  He denies any paroxysmal nocturnal dyspnea.  He denies any tachycardia.  He also denies any cough or wheezing.  His pulmonary exam today is normal.  I do not appreciate any crackles or rails to suggest underlying scar tissue or fibrosis.  He does not smoke.  He has no history of asthma.  He denies any melena or hematochezia.  Second issue, the patient has a hernia.  Has an umbilicus hernia that is roughly 3 cm in diameter.  It is freely reducible and nontender.  At that time, my plan was: Obtain lab work for his physical exam including CBC CMP lipid panel and PSA.  Discussed umbilical hernias.  Discussed warning signs of incarceration.  Offered general surgery consultation.  He plans to discuss this with his wife and then let me know his decision.  Regarding dyspnea on exertion, I recommend an echocardiogram particularly given his BMI and his history of obstructive sleep apnea to rule out heart failure or any evidence of decreased ventricular function.  Obtain chest x-ray given his history of COVID to evaluate for any residual scar tissue.  Check CBC to rule out anemia.  Consider pulmonary function test.  04/09/21  IMPRESSIONS (ECHO) 1. Left ventricular ejection fraction, by estimation, is 60 to 65%. The  left ventricle has normal function. The left ventricle has no regional  wall motion abnormalities. Left  ventricular diastolic parameters are  consistent with Grade I diastolic  dysfunction (impaired relaxation).  2. Right ventricular systolic function is normal. The right ventricular  size is normal. Tricuspid regurgitation signal is inadequate for assessing  PA pressure.  3. The mitral valve is normal in structure. No evidence of mitral valve  regurgitation. No evidence of mitral stenosis.  4. The aortic valve is normal in structure. Aortic valve regurgitation is  not visualized. No aortic stenosis is present.  5. Aortic dilatation noted. There is mild dilatation of the ascending  aorta, measuring 39 mm.  6. The inferior vena cava is normal in size with greater than 50%  respiratory variability, suggesting right atrial pressure of 3 mmHg.   Chest x-ray shows underinflation and a right upper lobe granuloma.  His last colonoscopy was in 2017.  There were 2 tubular adenomas.  Repeat colonoscopy was recommended in 5 years which would be now.  Most recent lab work as shown below: Appointment on 04/08/2021  Component Date Value Ref Range Status  . Area-P 1/2 04/08/2021 2.48  cm2 Final  . S' Lateral 04/08/2021 3.20  cm Final  Office Visit on 03/26/2021  Component Date Value Ref Range Status  . WBC 03/26/2021 5.6  3.8 - 10.8 Thousand/uL Final  . RBC 03/26/2021 5.41  4.20 - 5.80 Million/uL Final  . Hemoglobin 03/26/2021 15.5  13.2 - 17.1 g/dL Final  . HCT 03/26/2021 45.8  38.5 - 50.0 % Final  . MCV 03/26/2021 84.7  80.0 - 100.0 fL Final  . MCH 03/26/2021 28.7  27.0 - 33.0 pg Final  . MCHC 03/26/2021 33.8  32.0 - 36.0 g/dL Final  . RDW 03/26/2021 12.2  11.0 - 15.0 % Final  . Platelets 03/26/2021 255  140 - 400 Thousand/uL Final  . MPV 03/26/2021 10.4  7.5 - 12.5 fL Final  . Neutro Abs 03/26/2021 3,102  1,500 - 7,800 cells/uL Final  . Lymphs Abs 03/26/2021 1,652  850 - 3,900 cells/uL Final  . Absolute Monocytes 03/26/2021 526  200 - 950 cells/uL Final  . Eosinophils Absolute 03/26/2021  218  15 - 500 cells/uL Final  . Basophils Absolute 03/26/2021 101  0 - 200 cells/uL Final  . Neutrophils Relative % 03/26/2021 55.4  % Final  . Total Lymphocyte 03/26/2021 29.5  % Final  . Monocytes Relative 03/26/2021 9.4  % Final  . Eosinophils Relative 03/26/2021 3.9  % Final  . Basophils Relative 03/26/2021 1.8  % Final  . Glucose, Bld 03/26/2021 99  65 - 99 mg/dL Final   Comment: .            Fasting reference interval .   . BUN 03/26/2021 14  7 - 25 mg/dL Final  . Creat 03/26/2021 1.19  0.70 - 1.33 mg/dL Final   Comment: For patients >82 years of age, the reference limit for Creatinine is approximately 13% higher for people identified as African-American. .   . GFR, Est Non African American 03/26/2021 68  > OR = 60 mL/min/1.61m2 Final  . GFR, Est African American 03/26/2021 79  > OR = 60 mL/min/1.71m2 Final  . BUN/Creatinine Ratio 09/98/3382 NOT APPLICABLE  6 - 22 (calc) Final  . Sodium 03/26/2021 138  135 - 146 mmol/L Final  . Potassium 03/26/2021 4.1  3.5 - 5.3 mmol/L Final  . Chloride 03/26/2021 102  98 - 110 mmol/L Final  . CO2 03/26/2021 25  20 - 32 mmol/L Final  . Calcium 03/26/2021 9.8  8.6 - 10.3 mg/dL Final  . Total Protein 03/26/2021 6.9  6.1 - 8.1 g/dL Final  . Albumin 03/26/2021 4.7  3.6 - 5.1 g/dL Final  . Globulin 03/26/2021 2.2  1.9 - 3.7 g/dL (calc) Final  . AG Ratio 03/26/2021 2.1  1.0 - 2.5 (calc) Final  . Total Bilirubin 03/26/2021 0.7  0.2 - 1.2 mg/dL Final  . Alkaline phosphatase (APISO) 03/26/2021 48  35 - 144 U/L Final  . AST 03/26/2021 25  10 - 35 U/L Final  . ALT 03/26/2021 28  9 - 46 U/L Final  . Cholesterol 03/26/2021 203* <200 mg/dL Final  . HDL 03/26/2021 59  > OR = 40 mg/dL Final  . Triglycerides 03/26/2021 93  <150 mg/dL Final  . LDL Cholesterol (Calc) 03/26/2021 125* mg/dL (calc) Final   Comment: Reference range: <100 . Desirable range <100 mg/dL for primary prevention;   <70 mg/dL for patients with CHD or diabetic patients  with > or =  2 CHD risk factors. [name] LDL-C is now calculated using the Martin-Hopkins  calculation, which is a validated novel method providing  better accuracy than the Friedewald equation in the  estimation of LDL-C.  Cresenciano Genre et al. Annamaria Helling. 5053;976(73): 2061-2068  (http://education.[facility].com/faq/FAQ164)   . Total CHOL/HDL Ratio 03/26/2021 3.4  <5.0 (calc) Final  . Non-HDL Cholesterol (Calc) 03/26/2021 144* <130 mg/dL (calc) Final   Comment: For patients with diabetes plus 1 major ASCVD risk  factor, treating to a non-HDL-C goal of <100 mg/dL  (LDL-C of <70 mg/dL) is considered a therapeutic  option.   [name] PSA 03/26/2021 1.18  < OR = 4.00 ng/mL Final   Comment: The total PSA value from this assay system is  standardized against the WHO standard. The test  result will be approximately 20% lower when compared  to the equimolar-standardized total PSA (Beckman  Coulter). Comparison of serial PSA results should be  interpreted with this fact in mind. . This test was performed using the Siemens  chemiluminescent method. Values obtained from  different assay methods cannot be used interchangeably. PSA levels, regardless of value, should not be interpreted as absolute evidence of the presence or absence of disease.    PSA was excellent.  Cholesterol was elevated given him a 10-year risk of cardiovascular disease around 5%.  As a result I recommended replacing fenofibrate with atorvastatin.  Otherwise his lab work was excellent. Past Medical History:  Diagnosis Date  . GERD (gastroesophageal reflux disease)   . Hyperlipidemia    Phreesia 05/01/2020  . 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 OSA (obstructive sleep apnea) 09/06/2016   AHI-30, CPAP   Past Surgical History:  Procedure Laterality Date  . DG 4TH DIGIT RIGHT HAND    . HERNIA REPAIR    . TONSILLECTOMY    . WISDOM TOOTH EXTRACTION     Current Outpatient Medications on File Prior to Visit  Medication Sig  Dispense Refill  . Ascorbic Acid (VITAMIN C) 1000 MG tablet Take 1,000 mg by mouth 2 (two) times daily.     [name] atorvastatin (LIPITOR) 20 MG tablet Take 1 tablet (20 mg total) by mouth daily. 90 tablet 0  . Cholecalciferol (VITAMIN D) 2000 units CAPS Take by mouth.    . meloxicam (MOBIC) 15 MG tablet TAKE 1 TABLET BY MOUTH DAILY AS NEEDED. 30 tablet 3  . omeprazole (PRILOSEC) 40 MG capsule Take 1 capsule (40 mg total) by mouth daily. 30 capsule 3  . OVER THE COUNTER MEDICATION Take 1 tablet by mouth daily. Multi Vitamin - [employer] Brand     No current facility-administered medications on file prior to visit.   No Known Allergies Social History   Socioeconomic History  . Marital status: Married    Spouse name: Not on file  . Number of children: Not on file  . Years of education: Not on file  . Highest education level: Not on file  Occupational History  . Not on file  Tobacco Use  . Smoking status: Never Smoker  . Smokeless tobacco: Never Used  Substance and Sexual Activity  . Alcohol use: Yes    Alcohol/week: 0.0 standard drinks    Comment: Very seldom - beer  . Drug use: No  . Sexual activity: Not on file  Other Topics Concern  . Not on file  Social History Narrative  . Not on file   Social Determinants of Health   Financial Resource Strain: Not on file  Food Insecurity: Not on file  Transportation Needs: Not on file  Physical Activity: Not on file  Stress: Not on file  Social Connections: Not on file  Intimate Partner Violence: Not on file   Family History  Problem Relation Age of Onset  . Dementia Mother   . Atrial fibrillation Father   . Colon polyps Father   . COPD Maternal Grandfather   . Colon cancer Neg Hx   . Esophageal  cancer Neg Hx   . Rectal cancer Neg Hx   . Stomach cancer Neg Hx       Review of Systems  All other systems reviewed and are negative.      Objective:   Physical Exam Vitals reviewed.  Constitutional:      General: He is not in  acute distress.    Appearance: He is well-developed. He is not diaphoretic.  HENT:     Head: Normocephalic and atraumatic.     Right Ear: External ear normal.     Left Ear: External ear normal.     Nose: Nose normal.     Mouth/Throat:     Pharynx: No oropharyngeal exudate.  Eyes:     General: No scleral icterus.       Right eye: No discharge.        Left eye: No discharge.     Conjunctiva/sclera: Conjunctivae normal.     Pupils: Pupils are equal, round, and reactive to light.  Neck:     Thyroid: No thyromegaly.     Vascular: No JVD.     Trachea: No tracheal deviation.  Cardiovascular:     Rate and Rhythm: Normal rate and regular rhythm.     Heart sounds: Normal heart sounds. No murmur heard. No friction rub. No gallop.   Pulmonary:     Effort: Pulmonary effort is normal. No respiratory distress.     Breath sounds: Normal breath sounds. No stridor. No wheezing or rales.  Chest:     Chest wall: No tenderness.  Abdominal:     General: Bowel sounds are normal. There is no distension.     Palpations: Abdomen is soft. There is no mass.     Tenderness: There is no abdominal tenderness. There is no guarding or rebound.     Hernia: A hernia is present.    Genitourinary:    Testes: Cremasteric reflex is present.  Musculoskeletal:        General: No tenderness. Normal range of motion.     Cervical back: Normal range of motion and neck supple.  Lymphadenopathy:     Cervical: No cervical adenopathy.  Skin:    General: Skin is warm.     Coloration: Skin is not pale.     Findings: No erythema or rash.  Neurological:     Mental Status: He is alert and oriented to person, place, and time.     Cranial Nerves: No cranial nerve deficit.     Motor: No abnormal muscle tone.     Coordination: Coordination normal.     Deep Tendon Reflexes: Reflexes are normal and symmetric.  Psychiatric:        Behavior: Behavior normal.        Thought Content: Thought content normal.        Judgment:  Judgment normal.           Assessment & Plan:  General medical exam  Umbilical hernia without obstruction and without gangrene  Obstructive sleep apnea treated with continuous positive airway pressure (CPAP)  Recommended diet exercise and weight loss.  Echocardiogram is reassuring.  Chest x-ray is reassuring although I will wait for the formal read from radiology.  Recommended a colonoscopy.  Patient will call me when he is ready for me to schedule this.  Recommended the fourth dose of the COVID-vaccine given his severe previous infection.  Recommended the shingles vaccine.  Blood pressure is acceptable.  Recommended switching fenofibrate to atorvastatin.  Otherwise physical exam  today is reassuring.

## 2021-05-23 IMAGING — DX DG CHEST 1V
1 series · 1 of 1 positions shown · non-contrast
Comparison: November 18, 2019.

CLINICAL DATA: Acute respiratory failure due to [id number].

EXAM:
CHEST  1 VIEW

[chest]
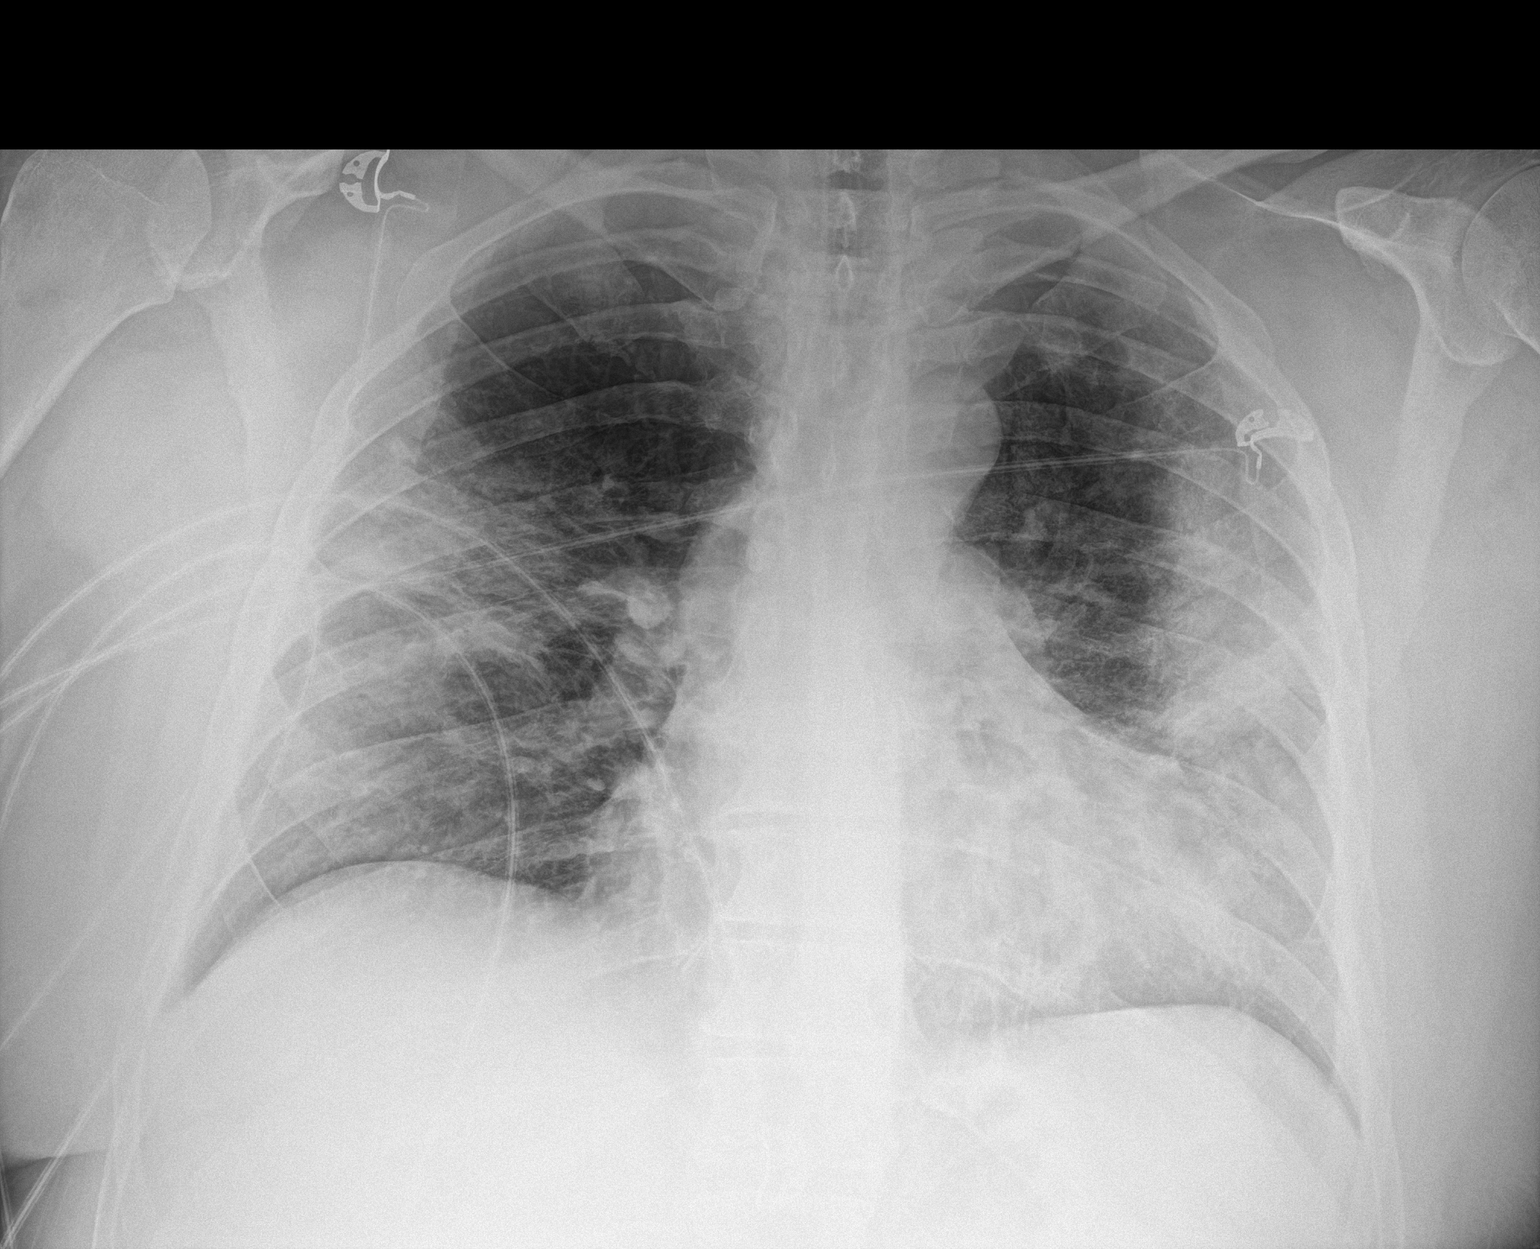

[1 of 1 positions shown; findings below may reference images not displayed]

FINDINGS: Stable cardiomediastinal silhouette. Significantly increased
bilateral peripheral lung opacities are noted consistent with
multifocal pneumonia, potentially of viral etiology. No pneumothorax
or pleural effusion is noted. Bony thorax is unremarkable.
IMPRESSION: Significantly increased bilateral peripheral lung opacities are
noted consistent with multifocal pneumonia, potentially of viral
etiology. Followup radiographs are recommended until resolution.

## 2021-05-25 IMAGING — CT CT ANGIO CHEST
1 of 2 series · 14 of 32 positions shown · IV contrast (OMNIPAQUE)
Comparison: Chest radiograph 11/25/2019.

CLINICAL DATA: 53-year-old male with shortness of breath and
elevated D-dimer.

EXAM:
CT ANGIOGRAPHY CHEST WITH CONTRAST
TECHNIQUE: Multidetector CT imaging of the chest was performed using the
standard protocol during bolus administration of intravenous
contrast. Multiplanar CT image reconstructions and MIPs were
obtained to evaluate the vascular anatomy.
CONTRAST:  80mL OMNIPAQUE IOHEXOL 350 MG/ML SOLN

[Series 4: pe chest · axial · 0.82mm/px · z∈[+895,+1159]mm · 14 of 158 slices shown]
[im 13/158  lung]
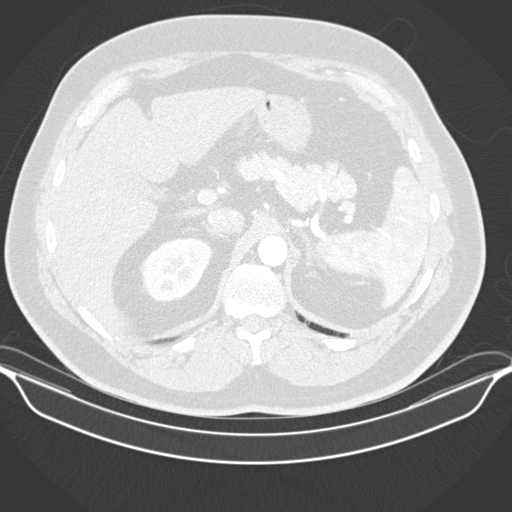
[im 25/158  mediastinal]
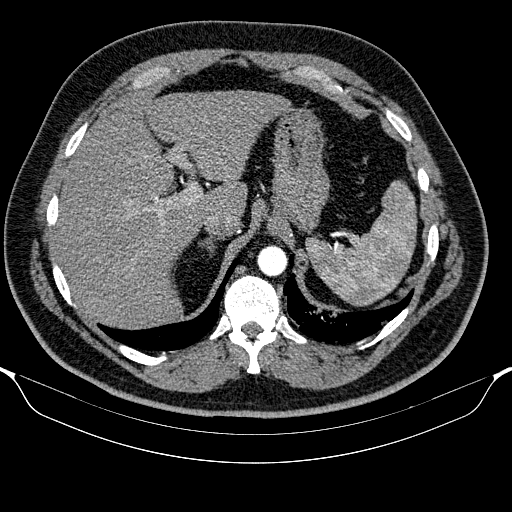
[im 37/158  lung]
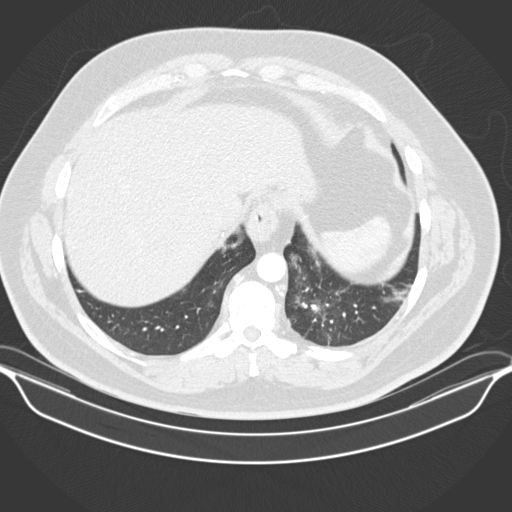
[im 49/158  mediastinal]
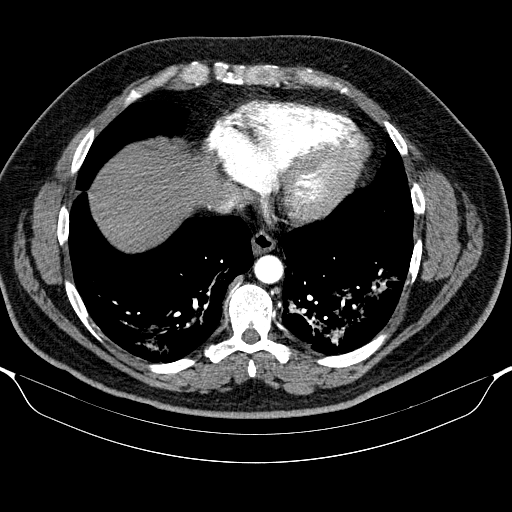
[im 61/158  lung]
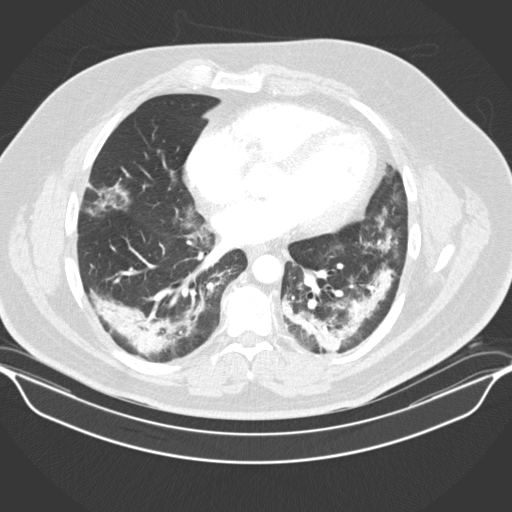
[im 73/158  mediastinal]
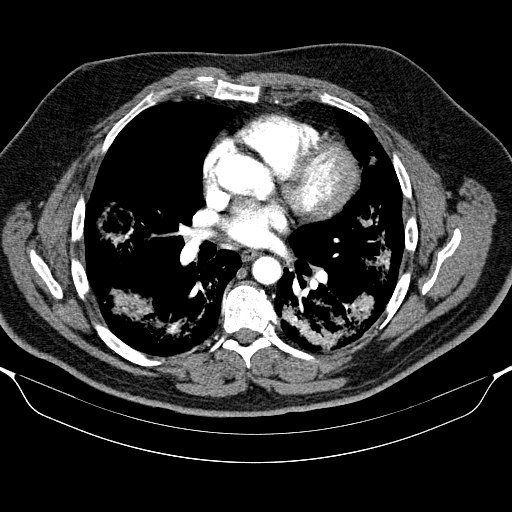
[im 75/158  lung]
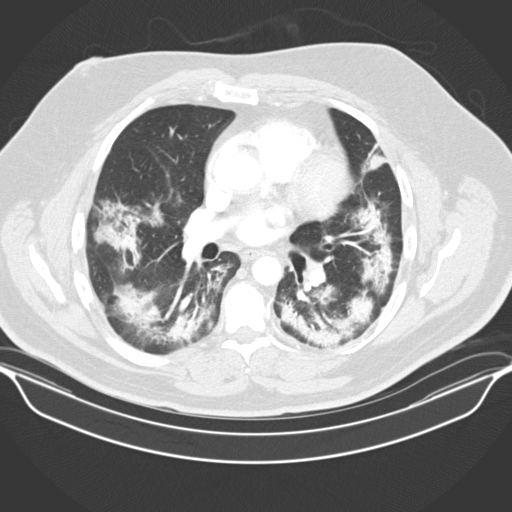
[im 79/158  mediastinal]
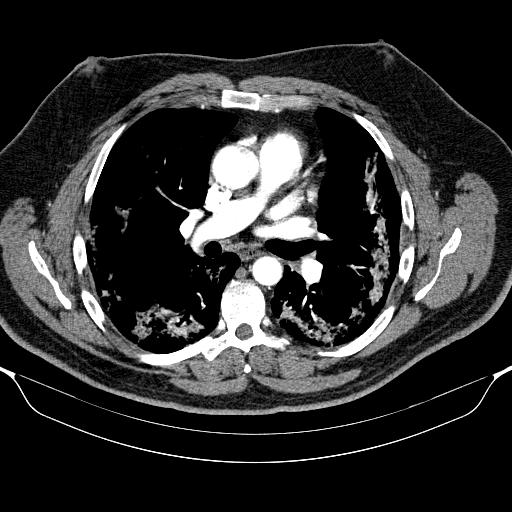
[im 85/158  lung]
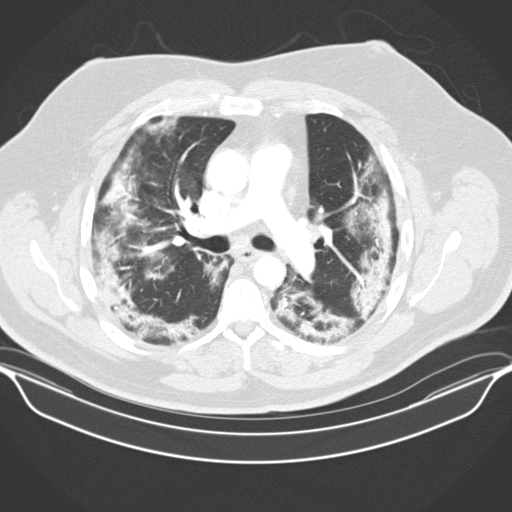
[im 97/158  mediastinal]
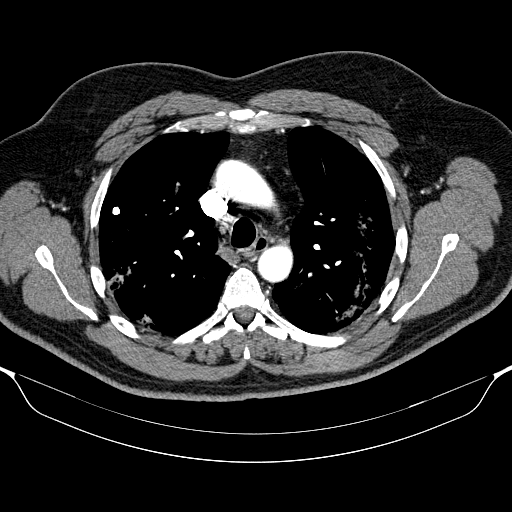
[im 109/158  lung]
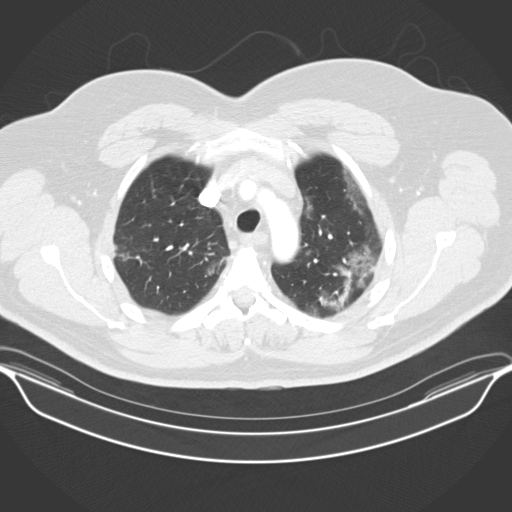
[im 121/158  mediastinal]
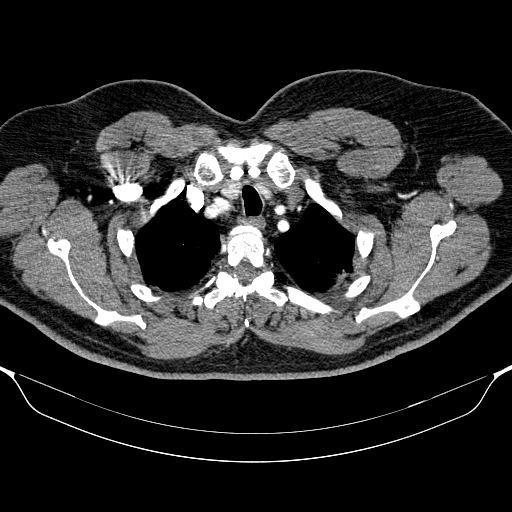
[im 133/158  lung]
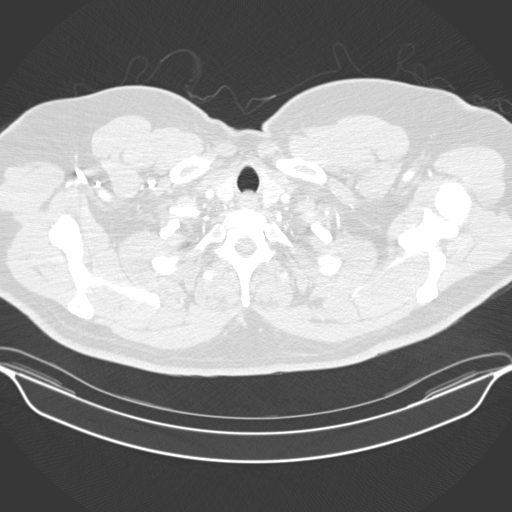
[im 145/158  mediastinal]
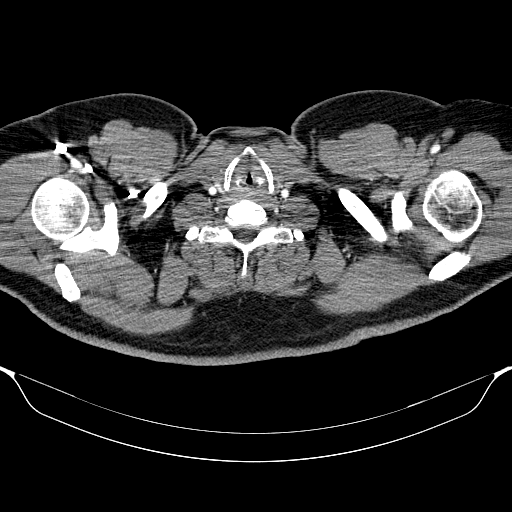

[14 of 32 positions shown; findings below may reference images not displayed]

FINDINGS: Cardiovascular: Top-normal cardiac size. No pericardial effusion.
The thoracic aorta is unremarkable. The origins of the great vessels
of the aortic arch appear patent. No CT evidence of pulmonary
embolism.

Mediastinum/Nodes: No hilar or mediastinal adenopathy. Esophagus and
the thyroid gland are grossly unremarkable. No mediastinal fluid
collection.

Lungs/Pleura: Bilateral peripheral and subpleural patchy and streaky
densities most consistent with multifocal pneumonia, likely viral or
atypical in etiology including [id number]. Clinical correlation is
recommended. There is a subcentimeter right upper lobe calcified
granuloma. There is no pleural effusion or pneumothorax. The central
airways are patent.

Upper Abdomen: Probable fatty infiltration of the liver.

Musculoskeletal: Degenerative changes of the spine. No acute osseous
pathology.

Review of the MIP images confirms the above findings.
IMPRESSION: 1. No CT evidence of pulmonary embolism.
2. Multifocal pneumonia. Clinical correlation and follow-up to
resolution recommended.

## 2021-05-30 ENCOUNTER — Other Ambulatory Visit: Payer: Self-pay | Admitting: Family Medicine

## 2021-06-06 ENCOUNTER — Other Ambulatory Visit: Payer: Self-pay | Admitting: Family Medicine

## 2021-06-08 NOTE — Telephone Encounter (Signed)
Called pt, lm to clarify if pt is taking fenofibrate.

## 2021-06-09 NOTE — Telephone Encounter (Signed)
Med D/C on 03/26/2021.  Refill refused.

## 2021-06-25 ENCOUNTER — Other Ambulatory Visit: Payer: Self-pay | Admitting: Family Medicine

## 2021-09-23 ENCOUNTER — Other Ambulatory Visit: Payer: Self-pay | Admitting: Family Medicine

## 2021-10-01 ENCOUNTER — Other Ambulatory Visit: Payer: Self-pay | Admitting: Family Medicine

## 2021-11-08 DIAGNOSIS — G4733 Obstructive sleep apnea (adult) (pediatric): Secondary | ICD-10-CM | POA: Diagnosis not present

## 2021-12-21 ENCOUNTER — Other Ambulatory Visit: Payer: Self-pay | Admitting: Family Medicine

## 2022-02-01 ENCOUNTER — Other Ambulatory Visit: Payer: Self-pay | Admitting: Family Medicine

## 2022-03-20 ENCOUNTER — Other Ambulatory Visit: Payer: Self-pay | Admitting: Family Medicine

## 2022-03-21 NOTE — Telephone Encounter (Signed)
Requested Prescriptions  ?Pending Prescriptions Disp Refills  ?? omeprazole (PRILOSEC) 40 MG capsule [Pharmacy Med Name: OMEPRAZOLE DR 40 MG CAPSULE] 90 capsule 1  ?  Sig: TAKE 1 CAPSULE (40 MG TOTAL) BY MOUTH DAILY.  ?  ? Gastroenterology: Proton Pump Inhibitors Passed - 03/20/2022 10:00 AM  ?  ?  Passed - Valid encounter within last 12 months  ?  Recent Outpatient Visits   ?      ? 11 months ago General medical exam  ? [facility] Susy Frizzle, MD  ? 12 months ago General medical exam  ? [facility] Susy Frizzle, MD  ? 1 year ago Hypertriglyceridemia  ? [facility] Pickard, Cammie Mcgee, MD  ? 2 years ago COVID-19  ? [facility] Pickard, Cammie Mcgee, MD  ? 2 years ago COVID-19 virus infection  ? [facility] Pickard, Cammie Mcgee, MD  ?  ?  ? ?  ?  ?  ? ? ?

## 2022-05-12 ENCOUNTER — Other Ambulatory Visit: Payer: 59

## 2022-05-12 DIAGNOSIS — N179 Acute kidney failure, unspecified: Secondary | ICD-10-CM

## 2022-05-12 DIAGNOSIS — J9600 Acute respiratory failure, unspecified whether with hypoxia or hypercapnia: Secondary | ICD-10-CM

## 2022-05-12 DIAGNOSIS — K219 Gastro-esophageal reflux disease without esophagitis: Secondary | ICD-10-CM

## 2022-05-13 ENCOUNTER — Other Ambulatory Visit: Payer: 59

## 2022-05-13 LAB — CBC WITH DIFFERENTIAL/PLATELET
Absolute Monocytes: 521 cells/uL (ref 200–950)
Basophils Absolute: 67 cells/uL (ref 0–200)
Basophils Relative: 1.2 %
Eosinophils Absolute: 207 cells/uL (ref 15–500)
Eosinophils Relative: 3.7 %
HCT: 47.3 % (ref 38.5–50.0)
Hemoglobin: 16.1 g/dL (ref 13.2–17.1)
Lymphs Abs: 1602 cells/uL (ref 850–3900)
MCH: 29 pg (ref 27.0–33.0)
MCHC: 34 g/dL (ref 32.0–36.0)
MCV: 85.1 fL (ref 80.0–100.0)
MPV: 10.4 fL (ref 7.5–12.5)
Monocytes Relative: 9.3 %
Neutro Abs: 3203 cells/uL (ref 1500–7800)
Neutrophils Relative %: 57.2 %
Platelets: 192 10*3/uL (ref 140–400)
RBC: 5.56 10*6/uL (ref 4.20–5.80)
RDW: 12.3 % (ref 11.0–15.0)
Total Lymphocyte: 28.6 %
WBC: 5.6 10*3/uL (ref 3.8–10.8)

## 2022-05-13 LAB — COMPREHENSIVE METABOLIC PANEL
AG Ratio: 1.8 (calc) (ref 1.0–2.5)
ALT: 34 U/L (ref 9–46)
AST: 27 U/L (ref 10–35)
Albumin: 4.4 g/dL (ref 3.6–5.1)
Alkaline phosphatase (APISO): 77 U/L (ref 35–144)
BUN: 17 mg/dL (ref 7–25)
CO2: 24 mmol/L (ref 20–32)
Calcium: 9.1 mg/dL (ref 8.6–10.3)
Chloride: 104 mmol/L (ref 98–110)
Creat: 1.03 mg/dL (ref 0.70–1.30)
Globulin: 2.5 g/dL (calc) (ref 1.9–3.7)
Glucose, Bld: 103 mg/dL — ABNORMAL HIGH (ref 65–99)
Potassium: 4.2 mmol/L (ref 3.5–5.3)
Sodium: 139 mmol/L (ref 135–146)
Total Bilirubin: 0.5 mg/dL (ref 0.2–1.2)
Total Protein: 6.9 g/dL (ref 6.1–8.1)

## 2022-05-13 LAB — PSA: PSA: 1.21 ng/mL (ref <=4.00)

## 2022-05-13 LAB — LIPID PANEL
Cholesterol: 117 mg/dL (ref <200)
HDL: 45 mg/dL (ref >=40)
LDL Cholesterol (Calc): 52 mg/dL (calc)
Non-HDL Cholesterol (Calc): 72 mg/dL (calc) (ref <130)
Total CHOL/HDL Ratio: 2.6 (calc) (ref <5.0)
Triglycerides: 117 mg/dL (ref <150)

## 2022-05-24 ENCOUNTER — Encounter: Payer: Self-pay | Admitting: Family Medicine

## 2022-05-24 ENCOUNTER — Ambulatory Visit (INDEPENDENT_AMBULATORY_CARE_PROVIDER_SITE_OTHER): Payer: 59 | Admitting: Family Medicine

## 2022-05-24 VITALS — BP 132/74 | HR 65 | Temp 97.7°F | Ht 73.0 in | Wt 313.0 lb

## 2022-05-24 DIAGNOSIS — Z0000 Encounter for general adult medical examination without abnormal findings: Secondary | ICD-10-CM | POA: Diagnosis not present

## 2022-05-24 DIAGNOSIS — D126 Benign neoplasm of colon, unspecified: Secondary | ICD-10-CM | POA: Diagnosis not present

## 2022-05-24 NOTE — Progress Notes (Signed)
Subjective:    Patient ID: Adam [name], male    DOB: 12-23-1965, 56 y.o.   MRN: [id number]  HPI  Patient is here today for complete physical exam.  His last colonoscopy was in 2017.  He had several tubular adenomas and they recommended a repeat colonoscopy in 3 to 5 years.  Therefore he is overdue.  His most recent PSA was essentially the same.  He is due for a shingles vaccine.  I did recommend a booster on his COVID shot.  Otherwise his immunizations are up-to-date.  His most recent lab work is shown below in his physical exam area. Wt Readings from Last 3 Encounters:  05/24/22 (!) 313 lb (142 kg)  04/09/21 (!) 316 lb (143.3 kg)  03/26/21 (!) 317 lb (143.8 kg)     Past Medical History:  Diagnosis Date   GERD (gastroesophageal reflux disease)    Hyperlipidemia    Phreesia 05/01/2020   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)   OSA (obstructive sleep apnea) 09/06/2016   AHI-30, CPAP   Past Surgical History:  Procedure Laterality Date   DG 4TH DIGIT RIGHT HAND     HERNIA REPAIR     TONSILLECTOMY     WISDOM TOOTH EXTRACTION     Current Outpatient Medications on File Prior to Visit  Medication Sig Dispense Refill   Ascorbic Acid (VITAMIN C) 1000 MG tablet Take 1,000 mg by mouth 2 (two) times daily.      atorvastatin (LIPITOR) 20 MG tablet TAKE 1 TABLET BY MOUTH EVERY DAY 90 tablet 1   Cholecalciferol (VITAMIN D) 2000 units CAPS Take by mouth.     meloxicam (MOBIC) 15 MG tablet TAKE 1 TABLET BY MOUTH EVERY DAY AS NEEDED 30 tablet 3   omeprazole (PRILOSEC) 40 MG capsule TAKE 1 CAPSULE (40 MG TOTAL) BY MOUTH DAILY. 90 capsule 0   OVER THE COUNTER MEDICATION Take 1 tablet by mouth daily. Multi Vitamin - [employer] Brand     No current facility-administered medications on file prior to visit.   No Known Allergies Social History   Socioeconomic History   Marital status: Married    Spouse name: Not on file   Number of children: Not on file   Years of education: Not  on file   Highest education level: Not on file  Occupational History   Not on file  Tobacco Use   Smoking status: Never   Smokeless tobacco: Never  Substance and Sexual Activity   Alcohol use: Yes    Alcohol/week: 0.0 standard drinks of alcohol    Comment: Very seldom - beer   Drug use: No   Sexual activity: Not on file  Other Topics Concern   Not on file  Social History Narrative   Not on file   Social Determinants of Health   Financial Resource Strain: Not on file  Food Insecurity: Not on file  Transportation Needs: Not on file  Physical Activity: Not on file  Stress: Not on file  Social Connections: Not on file  Intimate Partner Violence: Not on file   Family History  Problem Relation Age of Onset   Dementia Mother    Atrial fibrillation Father    Colon polyps Father    COPD Maternal Grandfather    Colon cancer Neg Hx    Esophageal cancer Neg Hx    Rectal cancer Neg Hx    Stomach cancer Neg Hx       Review  of Systems  All other systems reviewed and are negative.      Objective:   Physical Exam Vitals reviewed.  Constitutional:      General: He is not in acute distress.    Appearance: He is well-developed. He is not diaphoretic.  HENT:     Head: Normocephalic and atraumatic.     Right Ear: External ear normal.     Left Ear: External ear normal.     Nose: Nose normal.     Mouth/Throat:     Pharynx: No oropharyngeal exudate.  Eyes:     General: No scleral icterus.       Right eye: No discharge.        Left eye: No discharge.     Conjunctiva/sclera: Conjunctivae normal.     Pupils: Pupils are equal, round, and reactive to light.  Neck:     Thyroid: No thyromegaly.     Vascular: No JVD.     Trachea: No tracheal deviation.  Cardiovascular:     Rate and Rhythm: Normal rate and regular rhythm.     Heart sounds: Normal heart sounds. No murmur heard.    No friction rub. No gallop.  Pulmonary:     Effort: Pulmonary effort is normal. No respiratory  distress.     Breath sounds: Normal breath sounds. No stridor. No wheezing or rales.  Chest:     Chest wall: No tenderness.  Abdominal:     General: Bowel sounds are normal. There is no distension.     Palpations: Abdomen is soft. There is no mass.     Tenderness: There is no abdominal tenderness. There is no guarding or rebound.     Hernia: A hernia is present.  Genitourinary:    Testes: Cremasteric reflex is present.  Musculoskeletal:        General: No tenderness. Normal range of motion.     Cervical back: Normal range of motion and neck supple.  Lymphadenopathy:     Cervical: No cervical adenopathy.  Skin:    General: Skin is warm.     Coloration: Skin is not pale.     Findings: No erythema or rash.  Neurological:     Mental Status: He is alert and oriented to person, place, and time.     Cranial Nerves: No cranial nerve deficit.     Motor: No abnormal muscle tone.     Coordination: Coordination normal.     Deep Tendon Reflexes: Reflexes are normal and symmetric.  Psychiatric:        Behavior: Behavior normal.        Thought Content: Thought content normal.        Judgment: Judgment normal.     Lab on 05/12/2022  Component Date Value Ref Range Status   PSA 05/12/2022 1.21  < OR = 4.00 ng/mL Final   Comment: The total PSA value from this assay system is  standardized against the WHO standard. The test  result will be approximately 20% lower when compared  to the equimolar-standardized total PSA (Beckman  Coulter). Comparison of serial PSA results should be  interpreted with this fact in mind. . This test was performed using the Siemens  chemiluminescent method. Values obtained from  different assay methods cannot be used interchangeably. PSA levels, regardless of value, should not be interpreted as absolute evidence of the presence or absence of disease.    Glucose, Bld 05/12/2022 103 (H)  65 - 99 mg/dL Final   Comment: .  Fasting reference  interval . For someone without known diabetes, a glucose value between 100 and 125 mg/dL is consistent with prediabetes and should be confirmed with a follow-up test. .    BUN 05/12/2022 17  7 - 25 mg/dL Final   Creat 05/12/2022 1.03  0.70 - 1.30 mg/dL Final   BUN/Creatinine Ratio 96/02/5408 NOT APPLICABLE  6 - 22 (calc) Final   Sodium 05/12/2022 139  135 - 146 mmol/L Final   Potassium 05/12/2022 4.2  3.5 - 5.3 mmol/L Final   Chloride 05/12/2022 104  98 - 110 mmol/L Final   CO2 05/12/2022 24  20 - 32 mmol/L Final   Calcium 05/12/2022 9.1  8.6 - 10.3 mg/dL Final   Total Protein 05/12/2022 6.9  6.1 - 8.1 g/dL Final   Albumin 05/12/2022 4.4  3.6 - 5.1 g/dL Final   Globulin 05/12/2022 2.5  1.9 - 3.7 g/dL (calc) Final   AG Ratio 05/12/2022 1.8  1.0 - 2.5 (calc) Final   Total Bilirubin 05/12/2022 0.5  0.2 - 1.2 mg/dL Final   Alkaline phosphatase (APISO) 05/12/2022 77  35 - 144 U/L Final   AST 05/12/2022 27  10 - 35 U/L Final   ALT 05/12/2022 34  9 - 46 U/L Final   WBC 05/12/2022 5.6  3.8 - 10.8 Thousand/uL Final   RBC 05/12/2022 5.56  4.20 - 5.80 Million/uL Final   Hemoglobin 05/12/2022 16.1  13.2 - 17.1 g/dL Final   HCT 05/12/2022 47.3  38.5 - 50.0 % Final   MCV 05/12/2022 85.1  80.0 - 100.0 fL Final   MCH 05/12/2022 29.0  27.0 - 33.0 pg Final   MCHC 05/12/2022 34.0  32.0 - 36.0 g/dL Final   RDW 05/12/2022 12.3  11.0 - 15.0 % Final   Platelets 05/12/2022 192  140 - 400 Thousand/uL Final   MPV 05/12/2022 10.4  7.5 - 12.5 fL Final   Neutro Abs 05/12/2022 3,203  1,500 - 7,800 cells/uL Final   Lymphs Abs 05/12/2022 1,602  850 - 3,900 cells/uL Final   Absolute Monocytes 05/12/2022 521  200 - 950 cells/uL Final   Eosinophils Absolute 05/12/2022 207  15 - 500 cells/uL Final   Basophils Absolute 05/12/2022 67  0 - 200 cells/uL Final   Neutrophils Relative % 05/12/2022 57.2  % Final   Total Lymphocyte 05/12/2022 28.6  % Final   Monocytes Relative 05/12/2022 9.3  % Final   Eosinophils  Relative 05/12/2022 3.7  % Final   Basophils Relative 05/12/2022 1.2  % Final   Cholesterol 05/12/2022 117  <200 mg/dL Final   HDL 05/12/2022 45  > OR = 40 mg/dL Final   Triglycerides 05/12/2022 117  <150 mg/dL Final   LDL Cholesterol (Calc) 05/12/2022 52  mg/dL (calc) Final   Comment: Reference range: <100 . Desirable range <100 mg/dL for primary prevention;   <70 mg/dL for patients with CHD or diabetic patients  with > or = 2 CHD risk factors. [name] LDL-C is now calculated using the Martin-Hopkins  calculation, which is a validated novel method providing  better accuracy than the Friedewald equation in the  estimation of LDL-C.  Cresenciano Genre et al. Annamaria Helling. 8119;147(82): 2061-2068  (http://education.[facility].com/faq/FAQ164)    Total CHOL/HDL Ratio 05/12/2022 2.6  <5.0 (calc) Final   Non-HDL Cholesterol (Calc) 05/12/2022 72  <130 mg/dL (calc) Final   Comment: For patients with diabetes plus 1 major ASCVD risk  factor, treating to a non-HDL-C goal of <100 mg/dL  (LDL-C of <70 mg/dL) is  considered a therapeutic  option.          Assessment & Plan:  General medical exam  Adenomatous polyp of colon, unspecified part of colon - Plan: Ambulatory referral to Gastroenterology My biggest concern for this patient is his weight.  We had a long discussion about weight loss.  I recommended a low-carb diet.  Alternatively I recommended a weight loss program such as weight watchers.  Also recommended 30 minutes a day 5 days a week of aerobic exercise on a stationary bike.  We also discussed trying Wegovy.  He will check with his insurance regarding coverage.  I recommended shingles booster, I recommended a COVID booster.  I will schedule the patient for colonoscopy.  His PSA is excellent.

## 2022-06-03 ENCOUNTER — Encounter: Payer: Self-pay | Admitting: Family Medicine

## 2022-06-03 MED ORDER — MELOXICAM 15 MG PO TABS: 15.0000 mg | ORAL_TABLET | Freq: Every day | ORAL | 3 refills | Status: DC | PRN

## 2022-06-07 ENCOUNTER — Telehealth: Payer: Self-pay | Admitting: Gastroenterology

## 2022-06-09 NOTE — Telephone Encounter (Signed)
Dr Silverio Decamp Patients colon recall is 04/14/2026. Is this correct for him  PCP is wanting to refer him for recall colonoscopy

## 2022-06-17 ENCOUNTER — Other Ambulatory Visit: Payer: Self-pay | Admitting: Family Medicine

## 2022-06-18 ENCOUNTER — Other Ambulatory Visit: Payer: Self-pay | Admitting: Family Medicine

## 2022-06-20 NOTE — Telephone Encounter (Signed)
Pt had annual exam on 05/2022 Requested Prescriptions  Pending Prescriptions Disp Refills  . omeprazole (PRILOSEC) 40 MG capsule [Pharmacy Med Name: OMEPRAZOLE DR 40 MG CAPSULE] 90 capsule 0    Sig: TAKE 1 CAPSULE (40 MG TOTAL) BY MOUTH DAILY.     Gastroenterology: Proton Pump Inhibitors Failed - 06/18/2022 10:09 AM      Failed - Valid encounter within last 12 months    Recent Outpatient Visits          1 year ago General medical exam   [place] Susy Frizzle, MD   1 year ago General medical exam   [place] Susy Frizzle, MD   2 years ago Hypertriglyceridemia   [place] Dennard Schaumann, Cammie Mcgee, MD   2 years ago [place] Dennard Schaumann, Cammie Mcgee, MD   2 years ago COVID-9 virus infection   [place] Pickard, Cammie Mcgee, MD

## 2022-07-29 NOTE — Telephone Encounter (Signed)
He is due for surveillance colonoscopy due to h/o adenomatous colon polyps, ok to schedule direct colonoscopy. Thanks

## 2022-08-02 ENCOUNTER — Telehealth: Payer: Self-pay

## 2022-08-02 ENCOUNTER — Encounter: Payer: Self-pay | Admitting: Family Medicine

## 2022-08-02 NOTE — Telephone Encounter (Signed)
Pt has moved to [place]. Pt states insurance has approved his colonoscopy but he needs to have it done in [place] now. Pt asks if you have any recommendations on who can do the colonoscopy, in [place]? Thank you.

## 2022-08-02 NOTE — Telephone Encounter (Signed)
Called and spoke to patient.  Let him know he is due for colonoscopy now per Dr. Silverio Decamp. He has moved to [place] and will establish with GI provider there. Knows that he needs to schedule Colon cancer screening. Address  updated

## 2022-08-04 ENCOUNTER — Other Ambulatory Visit: Payer: Self-pay

## 2022-08-04 DIAGNOSIS — E7800 Pure hypercholesterolemia, unspecified: Secondary | ICD-10-CM

## 2022-08-04 DIAGNOSIS — E789 Disorder of lipoprotein metabolism, unspecified: Secondary | ICD-10-CM

## 2022-08-04 DIAGNOSIS — K219 Gastro-esophageal reflux disease without esophagitis: Secondary | ICD-10-CM

## 2022-08-04 DIAGNOSIS — M4306 Spondylolysis, lumbar region: Secondary | ICD-10-CM

## 2022-08-04 MED ORDER — MELOXICAM 15 MG PO TABS: 15.0000 mg | ORAL_TABLET | Freq: Every day | ORAL | 3 refills | Status: DC | PRN

## 2022-08-04 MED ORDER — ATORVASTATIN CALCIUM 20 MG PO TABS: 20.0000 mg | ORAL_TABLET | Freq: Every day | ORAL | 3 refills | Status: AC

## 2022-08-04 MED ORDER — OMEPRAZOLE 40 MG PO CPDR: 40.0000 mg | DELAYED_RELEASE_CAPSULE | Freq: Every day | ORAL | 3 refills | Status: AC

## 2023-01-31 ENCOUNTER — Other Ambulatory Visit: Payer: Self-pay

## 2023-01-31 DIAGNOSIS — M4306 Spondylolysis, lumbar region: Secondary | ICD-10-CM

## 2023-01-31 NOTE — Telephone Encounter (Signed)
Prescription Request  01/31/2023  LOV: 05/24/22  What is the name of the medication or equipment? meloxicam (MOBIC) 15 MG tablet [id number]   Have you contacted your pharmacy to request a refill? Yes   Which pharmacy would you like this sent to?  [id number] - [place], [place] - [address] [place] [address] Phone: [phone] Fax: [phone]    Patient notified that their request is being sent to the clinical staff for review and that they should receive a response within 2 business days.   Please advise at [facility] [phone]

## 2023-02-01 MED ORDER — MELOXICAM 15 MG PO TABS: 15.0000 mg | ORAL_TABLET | Freq: Every day | ORAL | 3 refills | Status: DC | PRN

## 2023-02-01 NOTE — Telephone Encounter (Signed)
Requested medication (s) are due for refill today: yes  Requested medication (s) are on the active medication list: yes  Last refill:  08/04/22  Future visit scheduled: no  Notes to clinic:  Unable to refill per protocol due to failed labs, no updated results.      Requested Prescriptions  Pending Prescriptions Disp Refills   meloxicam (MOBIC) 15 MG tablet 30 tablet 3    Sig: Take 1 tablet (15 mg total) by mouth daily as needed.     Analgesics:  COX2 Inhibitors Failed - 01/31/2023  1:09 PM      Failed - Manual Review: Labs are only required if the patient has taken medication for more than 8 weeks.      Failed - eGFR is 30 or above and within 360 days    GFR, Est African American  Date Value Ref Range Status  03/26/2021 79 > OR = 60 mL/min/1.72m2 Final   GFR, Est Non African American  Date Value Ref Range Status  03/26/2021 68 > OR = 60 mL/min/1.71m2 Final         Failed - Valid encounter within last 12 months    Recent Outpatient Visits           1 year ago General medical exam   [place] Susy Frizzle, MD   1 year ago General medical exam   [place] Susy Frizzle, MD   2 years ago Hypertriglyceridemia   [place] Susy Frizzle, MD   3 years ago [place] Dennard Schaumann, Cammie Mcgee, MD   3 years ago COVID-79 virus infection   [place] Pickard, Cammie Mcgee, MD              Passed - HGB in normal range and within 360 days    Hemoglobin  Date Value Ref Range Status  05/12/2022 16.1 13.2 - 17.1 g/dL Final         Passed - Cr in normal range and within 360 days    Creat  Date Value Ref Range Status  05/12/2022 1.03 0.70 - 1.30 mg/dL Final         Passed - HCT in normal range and within 360 days    HCT  Date Value Ref Range Status  05/12/2022 47.3 38.5 - 50.0 % Final         Passed - AST in normal range and within 360 days    AST  Date Value Ref  Range Status  05/12/2022 27 10 - 35 U/L Final         Passed - ALT in normal range and within 360 days    ALT  Date Value Ref Range Status  05/12/2022 34 9 - 46 U/L Final         Passed - Patient is not pregnant

## 2023-05-24 ENCOUNTER — Other Ambulatory Visit: Payer: Self-pay | Admitting: Family Medicine

## 2023-05-24 DIAGNOSIS — M4306 Spondylolysis, lumbar region: Secondary | ICD-10-CM

## 2023-05-24 NOTE — Telephone Encounter (Signed)
Requested medication (s) are due for refill today: yes  Requested medication (s) are on the active medication list: yes  Last refill:  02/01/23  Future visit scheduled: no  Notes to clinic:  Unable to refill per protocol due to failed labs, no updated results. Patient needs CPE.     Requested Prescriptions  Pending Prescriptions Disp Refills   meloxicam (MOBIC) 15 MG tablet [Pharmacy Med Name: MELOXICAM 15 MG TABLET] 30 tablet 3    Sig: TAKE 1 TABLET BY MOUTH DAILY AS NEEDED.     Analgesics:  COX2 Inhibitors Failed - 05/24/2023  2:18 AM      Failed - Manual Review: Labs are only required if the patient has taken medication for more than 8 weeks.      Failed - HGB in normal range and within 360 days    Hemoglobin  Date Value Ref Range Status  05/12/2022 16.1 13.2 - 17.1 g/dL Final         Failed - Cr in normal range and within 360 days    Creat  Date Value Ref Range Status  05/12/2022 1.03 0.70 - 1.30 mg/dL Final         Failed - HCT in normal range and within 360 days    HCT  Date Value Ref Range Status  05/12/2022 47.3 38.5 - 50.0 % Final         Failed - AST in normal range and within 360 days    AST  Date Value Ref Range Status  05/12/2022 27 10 - 35 U/L Final         Failed - ALT in normal range and within 360 days    ALT  Date Value Ref Range Status  05/12/2022 34 9 - 46 U/L Final         Failed - eGFR is 30 or above and within 360 days    GFR, Est African American  Date Value Ref Range Status  03/26/2021 79 > OR = 60 mL/min/1.52m2 Final   GFR, Est Non African American  Date Value Ref Range Status  03/26/2021 68 > OR = 60 mL/min/1.18m2 Final         Failed - Valid encounter within last 12 months    Recent Outpatient Visits           2 years ago General medical exam   [facility] Donita Brooks, MD   2 years ago General medical exam   [facility] Donita Brooks, MD   3 years ago Hypertriglyceridemia    [facility] Tanya Nones, Priscille Heidelberg, MD   3 years ago COVID-19   [facility] Pickard, Priscille Heidelberg, MD   3 years ago COVID-19 virus infection   [facility] Pickard, Priscille Heidelberg, MD              Passed - Patient is not pregnant

## 2023-09-11 ENCOUNTER — Other Ambulatory Visit: Payer: Self-pay

## 2023-09-11 DIAGNOSIS — E7800 Pure hypercholesterolemia, unspecified: Secondary | ICD-10-CM

## 2023-09-11 NOTE — Telephone Encounter (Signed)
Prescription Request  09/11/2023  LOV: 05/24/22  What is the name of the medication or equipment? atorvastatin (LIPITOR) 20 MG tablet [[id number]]  Have you contacted your pharmacy to request a refill? Yes   Which pharmacy would you like this sent to?  [phone] [facility] [place], [place] - [address] [place] [address] Phone: [phone] Fax: [phone]    Patient notified that their request is being sent to the clinical staff for review and that they should receive a response within 2 business days.   Please advise at [facility] [phone]

## 2023-09-12 NOTE — Telephone Encounter (Signed)
Requested medication (s) are due for refill today: Yes  Requested medication (s) are on the active medication list: Yes  Last refill:  08/04/22  Future visit scheduled: No  Notes to clinic:  Prescription has expired.    Requested Prescriptions  Pending Prescriptions Disp Refills   atorvastatin (LIPITOR) 20 MG tablet 90 tablet 3    Sig: Take 1 tablet (20 mg total) by mouth daily.     Cardiovascular:  Antilipid - Statins Failed - 09/11/2023 10:36 AM      Failed - Valid encounter within last 12 months    Recent Outpatient Visits           2 years ago General medical exam   [facility] Donita Brooks, MD   2 years ago General medical exam   [facility] Donita Brooks, MD   3 years ago Hypertriglyceridemia   [facility] Tanya Nones, Priscille Heidelberg, MD   3 years ago COVID-19   [facility] Tanya Nones, Priscille Heidelberg, MD   3 years ago COVID-19 virus infection   [facility] Tanya Nones, Priscille Heidelberg, MD              Failed - Lipid Panel in normal range within the last 12 months    Cholesterol  Date Value Ref Range Status  05/12/2022 117 <200 mg/dL Final   LDL Cholesterol (Calc)  Date Value Ref Range Status  05/12/2022 52 mg/dL (calc) Final    Comment:    Reference range: <100 . Desirable range <100 mg/dL for primary prevention;   <70 mg/dL for patients with CHD or diabetic patients  with > or = 2 CHD risk factors. [name] LDL-C is now calculated using the Martin-Hopkins  calculation, which is a validated novel method providing  better accuracy than the Friedewald equation in the  estimation of LDL-C.  Horald Pollen et al. Lenox Ahr. 2841;324(40): 2061-2068  (http://education.[facility].com/faq/FAQ164)    HDL  Date Value Ref Range Status  05/12/2022 45 > OR = 40 mg/dL Final   Triglycerides  Date Value Ref Range Status  05/12/2022 117 <150 mg/dL Final         Passed - Patient is not pregnant

## 2023-09-13 NOTE — Telephone Encounter (Signed)
Requested medication (s) are due for refill today: Yes  Requested medication (s) are on the active medication list: Yes  Last refill:  08/04/22  Future visit scheduled: No  Notes to clinic:  Unable to refill per protocol due to failed labs, no updated results. CPE needed     Requested Prescriptions  Pending Prescriptions Disp Refills   atorvastatin (LIPITOR) 20 MG tablet 90 tablet 3    Sig: Take 1 tablet (20 mg total) by mouth daily.     Cardiovascular:  Antilipid - Statins Failed - 09/13/2023 11:21 AM      Failed - Valid encounter within last 12 months    Recent Outpatient Visits           2 years ago General medical exam   [facility] Donita Brooks, MD   2 years ago General medical exam   [facility] Donita Brooks, MD   3 years ago Hypertriglyceridemia   [facility] Tanya Nones, Priscille Heidelberg, MD   3 years ago COVID-19   [facility] Tanya Nones, Priscille Heidelberg, MD   3 years ago COVID-19 virus infection   [facility] Tanya Nones, Priscille Heidelberg, MD              Failed - Lipid Panel in normal range within the last 12 months    Cholesterol  Date Value Ref Range Status  05/12/2022 117 <200 mg/dL Final   LDL Cholesterol (Calc)  Date Value Ref Range Status  05/12/2022 52 mg/dL (calc) Final    Comment:    Reference range: <100 . Desirable range <100 mg/dL for primary prevention;   <70 mg/dL for patients with CHD or diabetic patients  with > or = 2 CHD risk factors. [name] LDL-C is now calculated using the Martin-Hopkins  calculation, which is a validated novel method providing  better accuracy than the Friedewald equation in the  estimation of LDL-C.  Horald Pollen et al. Lenox Ahr. 6962;952(84): 2061-2068  (http://education.[facility].com/faq/FAQ164)    HDL  Date Value Ref Range Status  05/12/2022 45 > OR = 40 mg/dL Final   Triglycerides  Date Value Ref Range Status  05/12/2022 117 <150 mg/dL Final          Passed - Patient is not pregnant

## 2023-09-13 NOTE — Telephone Encounter (Signed)
Prescription Request  09/13/2023  LOV: 05/24/22  What is the name of the medication or equipment? omeprazole (PRILOSEC) 40 MG capsule [[id number]]  Have you contacted your pharmacy to request a refill? Yes   Which pharmacy would you like this sent to?  [facility] [phone] [facility] [place], [place] - [address] [place] [address] Phone: [phone] Fax: [phone]    Patient notified that their request is being sent to the clinical staff for review and that they should receive a response within 2 business days.   Please advise at [facility] [phone]
